# Patient Record
Sex: Male | Born: 1935 | Race: White | Hispanic: No | Smoking: Never smoker
Health system: Southern US, Community
[De-identification: ages and names within clinical notes are randomized; demographics above are authoritative.]

## PROBLEM LIST (undated history)

## (undated) DIAGNOSIS — I2510 Atherosclerotic heart disease of native coronary artery without angina pectoris: Secondary | ICD-10-CM

## (undated) DIAGNOSIS — I10 Essential (primary) hypertension: Secondary | ICD-10-CM

## (undated) HISTORY — DX: Atherosclerotic heart disease of native coronary artery without angina pectoris: I25.10

---

## 2006-11-30 ENCOUNTER — Emergency Department (HOSPITAL_COMMUNITY): Admission: EM | Admit: 2006-11-30 | Discharge: 2006-11-30 | Payer: Self-pay | Admitting: Emergency Medicine

## 2013-01-30 ENCOUNTER — Encounter (HOSPITAL_COMMUNITY): Payer: Self-pay

## 2013-01-30 ENCOUNTER — Emergency Department (HOSPITAL_COMMUNITY): Payer: Medicare Other

## 2013-01-30 ENCOUNTER — Emergency Department (HOSPITAL_COMMUNITY)
Admission: EM | Admit: 2013-01-30 | Discharge: 2013-01-30 | Disposition: A | Discharge status: Home / Self Care | Payer: Medicare Other | Attending: Emergency Medicine | Admitting: Emergency Medicine

## 2013-01-30 DIAGNOSIS — R51 Headache: Secondary | ICD-10-CM | POA: Insufficient documentation

## 2013-01-30 LAB — CBC WITH DIFFERENTIAL/PLATELET
Basophils Absolute: 0.1 10*3/uL (ref 0.0–0.1)
Eosinophils Absolute: 0.1 10*3/uL (ref 0.0–0.7)
Eosinophils Relative: 1 % (ref 0–5)
Lymphocytes Relative: 23 % (ref 12–46)
Lymphs Abs: 2 10*3/uL (ref 0.7–4.0)
MCV: 82 fL (ref 78.0–100.0)
Neutrophils Relative %: 67 % (ref 43–77)
Platelets: 286 10*3/uL (ref 150–400)
RBC: 5.12 MIL/uL (ref 4.22–5.81)
RDW: 15 % (ref 11.5–15.5)
WBC: 8.4 10*3/uL (ref 4.0–10.5)

## 2013-01-30 LAB — COMPREHENSIVE METABOLIC PANEL
ALT: 11 U/L (ref 0–53)
AST: 15 U/L (ref 0–37)
Alkaline Phosphatase: 70 U/L (ref 39–117)
CO2: 29 mEq/L (ref 19–32)
Calcium: 9.3 mg/dL (ref 8.4–10.5)
Glucose, Bld: 102 mg/dL — ABNORMAL HIGH (ref 70–99)
Potassium: 3.3 mEq/L — ABNORMAL LOW (ref 3.5–5.1)
Sodium: 136 mEq/L (ref 135–145)
Total Protein: 7.5 g/dL (ref 6.0–8.3)

## 2013-01-30 MED ORDER — KETOROLAC TROMETHAMINE 30 MG/ML IJ SOLN
15.0000 mg | Freq: Once | INTRAMUSCULAR | Status: AC
  Administered 2013-01-30: 15 mg via INTRAVENOUS
  Filled 2013-01-30: qty 1

## 2013-01-30 MED ORDER — TRAMADOL HCL 50 MG PO TABS: 50.0000 mg | ORAL_TABLET | Freq: Four times a day (QID) | ORAL | Status: DC | PRN

## 2013-01-30 MED ORDER — METOCLOPRAMIDE HCL 5 MG/ML IJ SOLN
5.0000 mg | Freq: Once | INTRAMUSCULAR | Status: AC
  Administered 2013-01-30: 5 mg via INTRAVENOUS
  Filled 2013-01-30: qty 2

## 2013-01-30 MED ORDER — DIPHENHYDRAMINE HCL 50 MG/ML IJ SOLN
12.5000 mg | Freq: Once | INTRAMUSCULAR | Status: AC
  Administered 2013-01-30: 12.5 mg via INTRAVENOUS
  Filled 2013-01-30: qty 1

## 2013-01-30 NOTE — ED Provider Notes (Signed)
History    This chart was scribed for Kevin Lennert, MD, by Frederik Pear, ED scribe. The patient was seen in room APA03/APA03 and the patient's care was started at 1539.    CSN: [id number]  Arrival date & time 01/30/13  1438   First MD Initiated Contact with Patient 01/30/13 1539      Chief Complaint  Patient presents with  . Headache    (Consider location/radiation/quality/duration/timing/severity/associated sxs/prior treatment) Patient is a 79 y.o. male presenting with headaches. The history is provided by the patient and medical records. No language interpreter was used.  Headache Pain location:  Occipital Quality:  Unable to specify Severity currently:  10/10 Onset quality:  Sudden Duration:  5 days Associated symptoms: no abdominal pain, no back pain, no congestion, no cough, no diarrhea, no fatigue, no fever, no seizures and no sinus pressure     HPI Comments: Kevin Faulkner is a 79 y.o. male who presents to the Emergency Department complaining of a sudden onset, constant posterior HA that radiates into his posterior neck that began 5 days ago. He denies fever or chills. He states that he has a h/o of headaches, but has not experienced one for a very long time. He denies allergies to medications.   History reviewed. No pertinent past medical history.  History reviewed. No pertinent past surgical history.  No family history on file.  History  Substance Use Topics  . Smoking status: Not on file  . Smokeless tobacco: Not on file  . Alcohol Use: No      Review of Systems  Constitutional: Negative for fever, appetite change and fatigue.  HENT: Negative for congestion, sinus pressure and ear discharge.   Eyes: Negative for discharge.  Respiratory: Negative for cough.   Cardiovascular: Negative for chest pain.  Gastrointestinal: Negative for abdominal pain and diarrhea.  Genitourinary: Negative for frequency and hematuria.  Musculoskeletal: Negative for back  pain.  Skin: Negative for rash.  Neurological: Positive for headaches. Negative for seizures.  Psychiatric/Behavioral: Negative for hallucinations.    Allergies  Review of patient's allergies indicates no known allergies.  Home Medications  No current outpatient prescriptions on file.  BP 124/67  Pulse 57  Temp(Src) 96.7 F (35.9 C) (Oral)  Resp 20  Ht 5\' 4"  (1.626 m)  Wt 161 lb (73.029 kg)  BMI 27.62 kg/m2  SpO2 97%  Physical Exam  Nursing note and vitals reviewed. Constitutional: He is oriented to person, place, and time. He appears well-developed.  HENT:  Head: Normocephalic.  Eyes: Conjunctivae and EOM are normal. No scleral icterus.  Neck: Neck supple. No thyromegaly present.  Cardiovascular: Normal rate and regular rhythm.  Exam reveals no gallop and no friction rub.   No murmur heard. Pulmonary/Chest: No stridor. He has no wheezes. He has no rales. He exhibits no tenderness.  Abdominal: He exhibits no distension. There is no tenderness. There is no rebound.  Musculoskeletal: Normal range of motion. He exhibits no edema.  Lymphadenopathy:    He has no cervical adenopathy.  Neurological: He is oriented to person, place, and time. Coordination normal.  Skin: No rash noted. No erythema.  Psychiatric: He has a normal mood and affect. His behavior is normal.    ED Course  Procedures (including critical care time)  DIAGNOSTIC STUDIES: Oxygen Saturation is 97% on room air, adequate by my interpretation.    COORDINATION OF CARE:  15:45- Discussed planned course of treatment with the patient, including Toradol, Benadryl, Reglan, Head CT without  contrast, and blood work, who is agreeable at this time.  16:00- Medication Orders- ketorolac (Toradol) 30 mg/ml injection 15 mg- once, diphenhydramine (benadryl) injection 12.5 mg- once, metoclopramide (reglan) injection 5 mg- once.   Labs Reviewed - No data to display No results found.   No diagnosis found.    MDM   Pt improved with tx The chart was scribed for me under my direct supervision.  I personally performed the history, physical, and medical decision making and all procedures in the evaluation of this patient.Kevin Lennert, MD 01/30/13 [phone]

## 2013-01-30 NOTE — ED Notes (Signed)
Complain of headache since Monday. Denies other symptoms

## 2014-02-20 IMAGING — CT CT HEAD W/O CM
1 of 2 series · 16 of 30 positions shown, 20 images · non-contrast
Comparison: None

CLINICAL DATA: 79-year-old male with severe headache.

CT HEAD WITHOUT CONTRAST
TECHNIQUE: Contiguous axial images were obtained from the base of
the skull through the vertex without contrast.

[Series 3: headtrauma 2.4 h60s · axial · 0.48mm/px · z∈[+90,+216]mm · 16 of 60 slices shown, 20 images]
[im 4/60  brain]
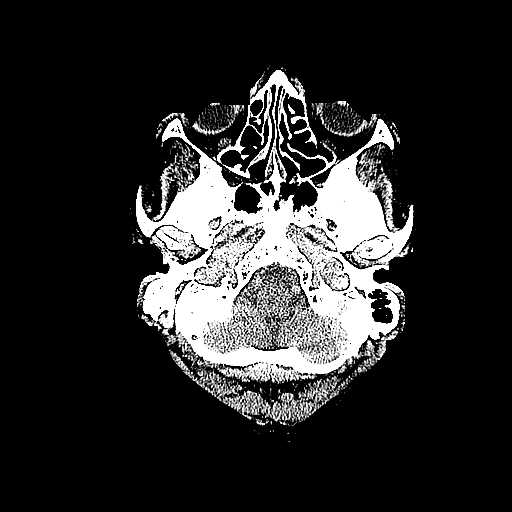
[im 4/60  bone]
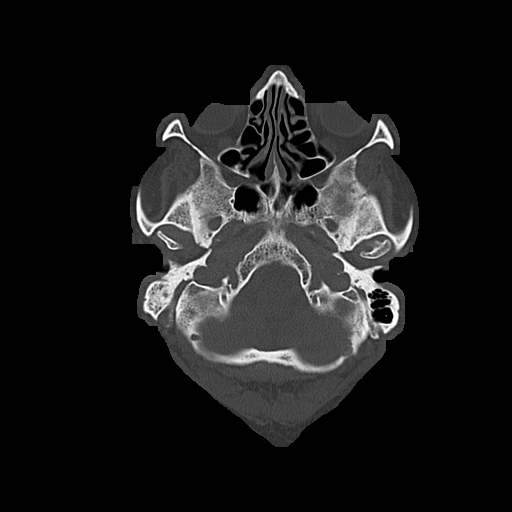
[im 7/60  brain]
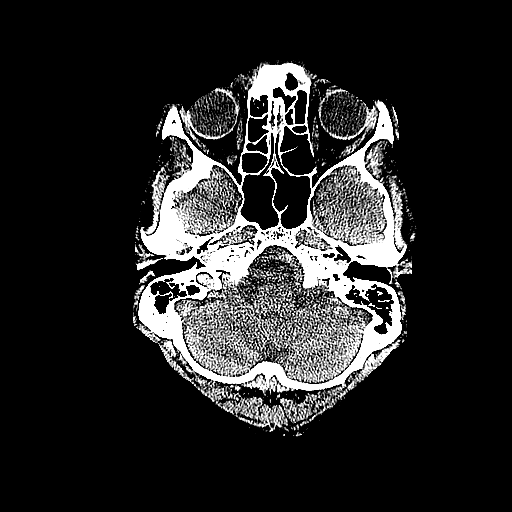
[im 10/60  brain]
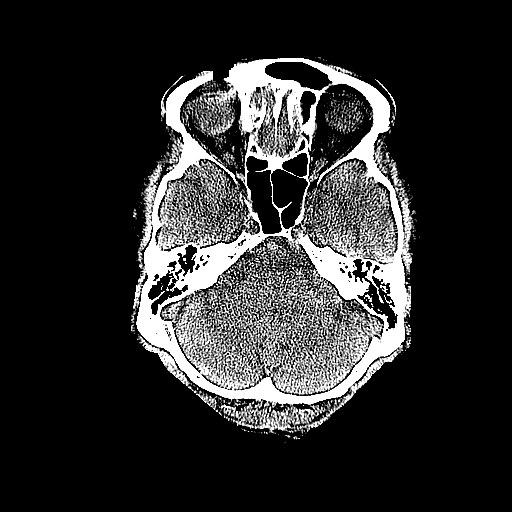
[im 13/60  brain]
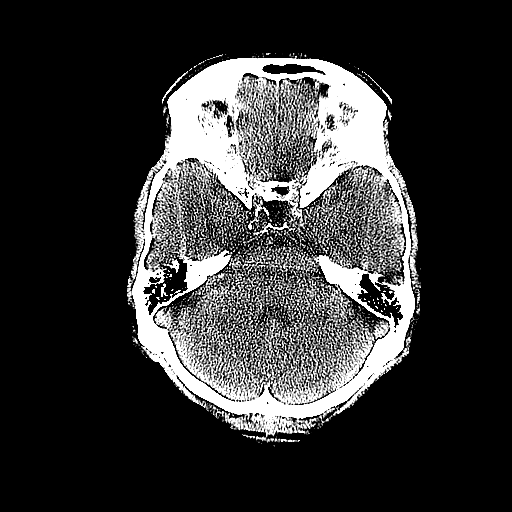
[im 19/60  brain]
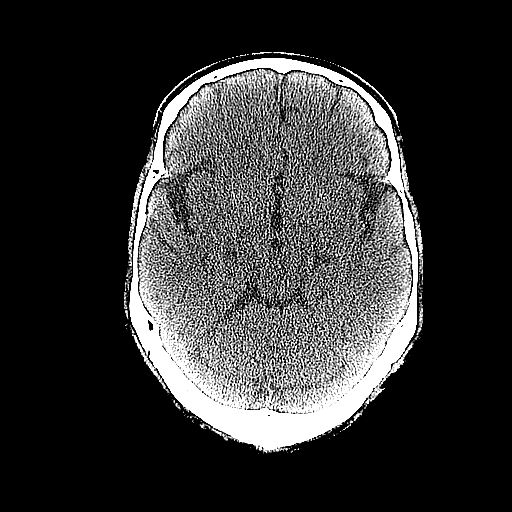
[im 19/60  bone]
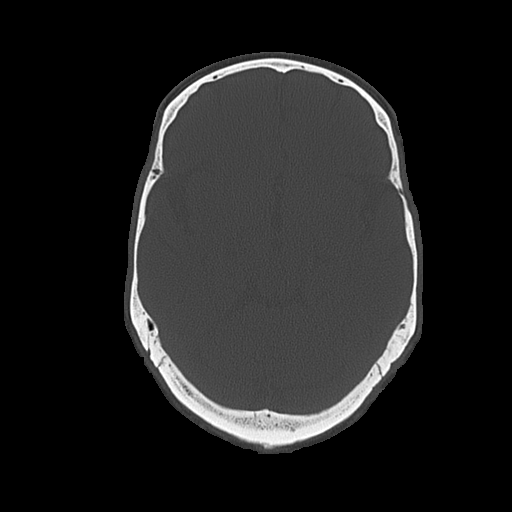
[im 22/60  brain]
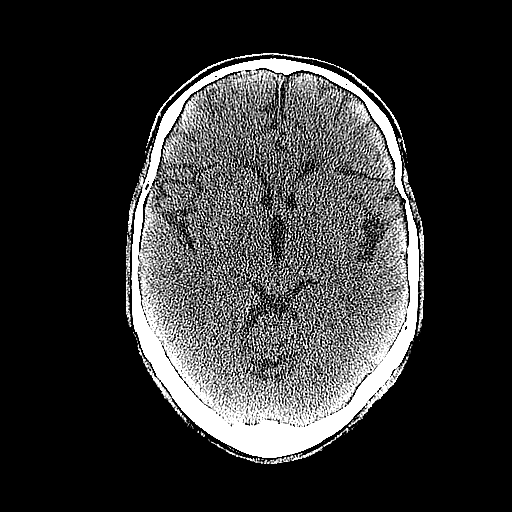
[im 25/60  brain]
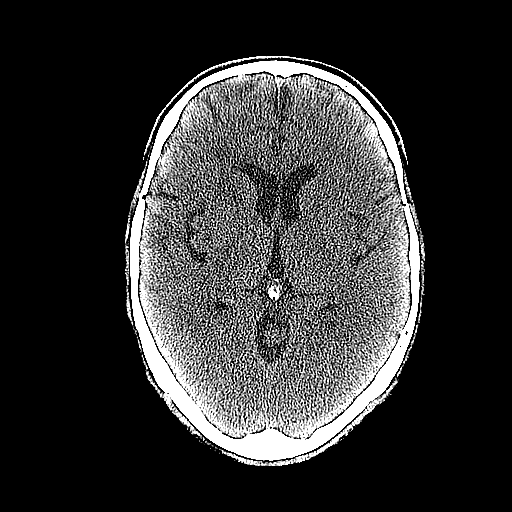
[im 28/60  brain]
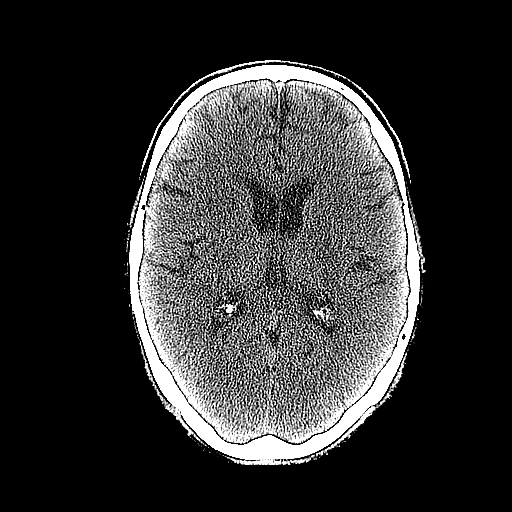
[im 32/60  brain]
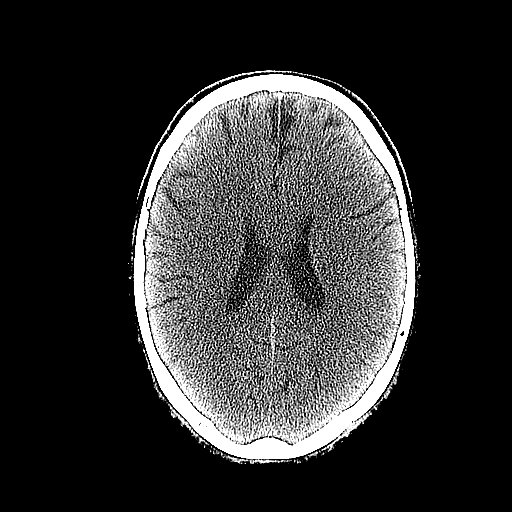
[im 32/60  bone]
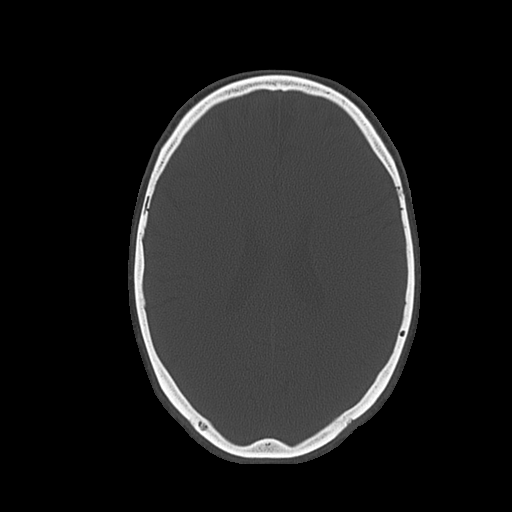
[im 35/60  brain]
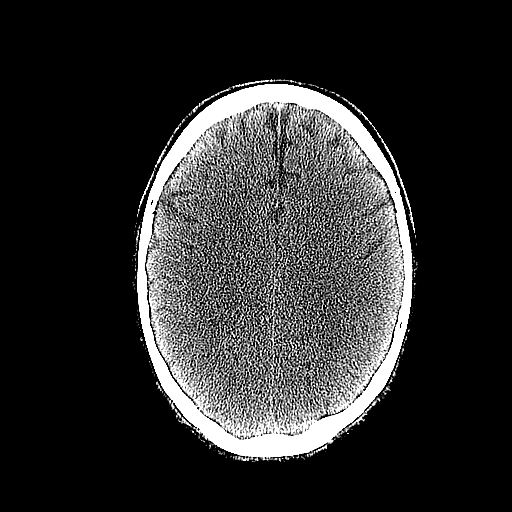
[im 38/60  brain]
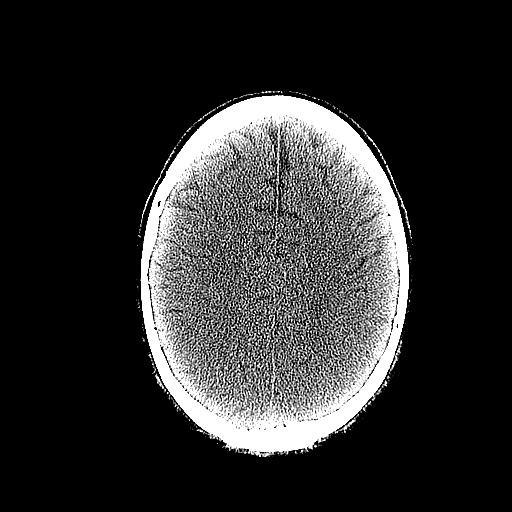
[im 41/60  brain]
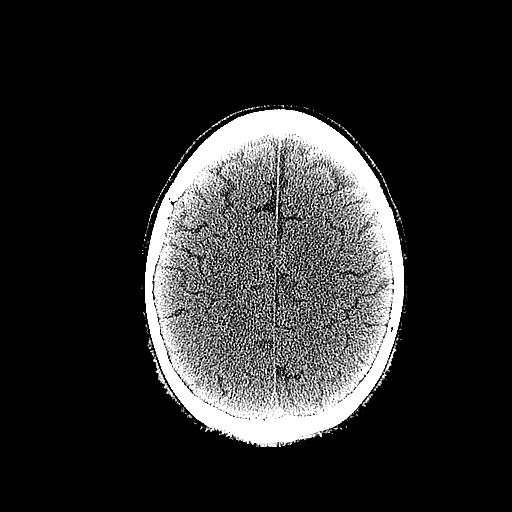
[im 47/60  brain]
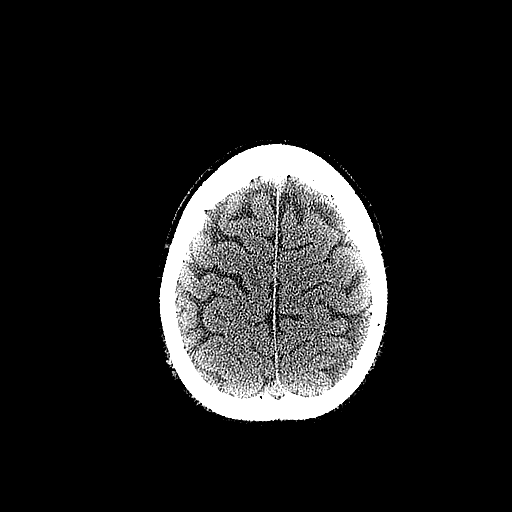
[im 47/60  bone]
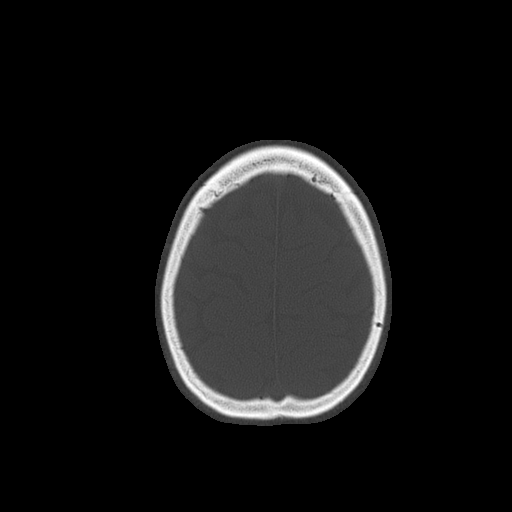
[im 50/60  brain]
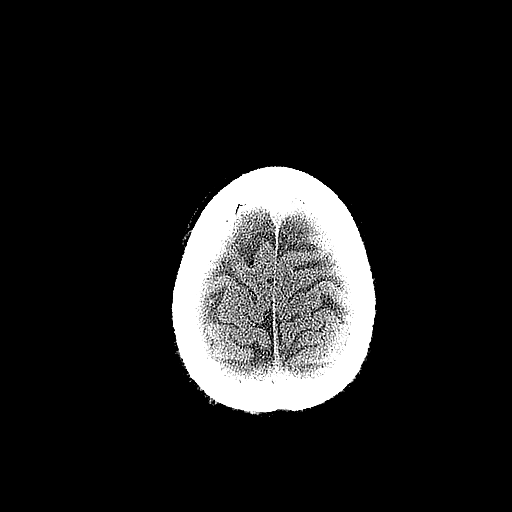
[im 53/60  brain]
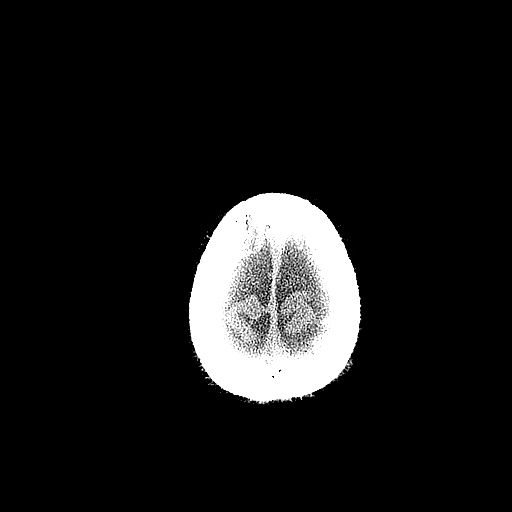
[im 56/60  brain]
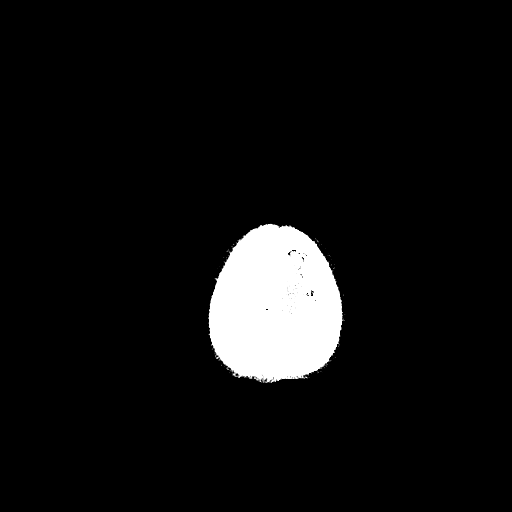

[16 of 30 positions shown; findings below may reference images not displayed]

FINDINGS: Probable mild chronic small vessel white matter ischemic
changes are noted.

No acute intracranial abnormalities are identified, including mass
lesion or mass effect, hydrocephalus, extra-axial fluid collection,
midline shift, hemorrhage, or acute infarction.

The visualized bony calvarium is unremarkable.
IMPRESSION: No evidence of acute intracranial abnormality.

## 2017-01-06 ENCOUNTER — Emergency Department (HOSPITAL_COMMUNITY): Payer: Medicare Other

## 2017-01-06 ENCOUNTER — Encounter (HOSPITAL_COMMUNITY): Payer: Self-pay | Admitting: Emergency Medicine

## 2017-01-06 ENCOUNTER — Emergency Department (HOSPITAL_COMMUNITY)
Admission: EM | Admit: 2017-01-06 | Discharge: 2017-01-06 | Disposition: A | Discharge status: Home / Self Care | Payer: Medicare Other | Attending: Dermatology | Admitting: Dermatology

## 2017-01-06 DIAGNOSIS — R531 Weakness: Secondary | ICD-10-CM | POA: Diagnosis present

## 2017-01-06 DIAGNOSIS — I10 Essential (primary) hypertension: Secondary | ICD-10-CM | POA: Diagnosis not present

## 2017-01-06 DIAGNOSIS — Z5321 Procedure and treatment not carried out due to patient leaving prior to being seen by health care provider: Secondary | ICD-10-CM | POA: Insufficient documentation

## 2017-01-06 HISTORY — DX: Essential (primary) hypertension: I10

## 2017-01-06 LAB — CBC
HCT: 40.1 % (ref 39.0–52.0)
HEMOGLOBIN: 13.1 g/dL (ref 13.0–17.0)
MCH: 28.1 pg (ref 26.0–34.0)
MCHC: 32.7 g/dL (ref 30.0–36.0)
MCV: 85.9 fL (ref 78.0–100.0)
Platelets: 188 10*3/uL (ref 150–400)
RBC: 4.67 MIL/uL (ref 4.22–5.81)
RDW: 14.3 % (ref 11.5–15.5)
WBC: 5.3 10*3/uL (ref 4.0–10.5)

## 2017-01-06 LAB — BASIC METABOLIC PANEL
Anion gap: 10 (ref 5–15)
BUN: 21 mg/dL — ABNORMAL HIGH (ref 6–20)
CHLORIDE: 98 mmol/L — AB (ref 101–111)
CO2: 26 mmol/L (ref 22–32)
CREATININE: 0.88 mg/dL (ref 0.61–1.24)
Calcium: 8.4 mg/dL — ABNORMAL LOW (ref 8.9–10.3)
GFR calc Af Amer: >60 mL/min (ref >60)
GFR calc non Af Amer: >60 mL/min (ref >60)
Glucose, Bld: 96 mg/dL (ref 65–99)
Potassium: 3.1 mmol/L — ABNORMAL LOW (ref 3.5–5.1)
Sodium: 134 mmol/L — ABNORMAL LOW (ref 135–145)

## 2017-01-06 LAB — CBG MONITORING, ED: Glucose-Capillary: 100 mg/dL — ABNORMAL HIGH (ref 65–99)

## 2017-01-06 LAB — LACTIC ACID, PLASMA: Lactic Acid, Venous: 0.7 mmol/L (ref 0.5–1.9)

## 2017-01-06 MED ORDER — ACETAMINOPHEN 325 MG PO TABS
650.0000 mg | ORAL_TABLET | Freq: Once | ORAL | Status: AC
  Administered 2017-01-06: 650 mg via ORAL
  Filled 2017-01-06: qty 2

## 2017-01-06 NOTE — ED Triage Notes (Signed)
Pt report generalized weakness with fever since Saturday.  States he is not able to walk like usual and keeps falling.  States he has a slight cough, but no other s/s of fever.  No meds taken pta.  Has fallen 3 times since yesterday and is usually active and independent.

## 2018-01-27 IMAGING — DX DG CHEST 2V
2 series · 2 of 2 positions shown · non-contrast
Comparison: None.

CLINICAL DATA: Fever and cough

EXAM:
CHEST  2 VIEW

[chest lat]
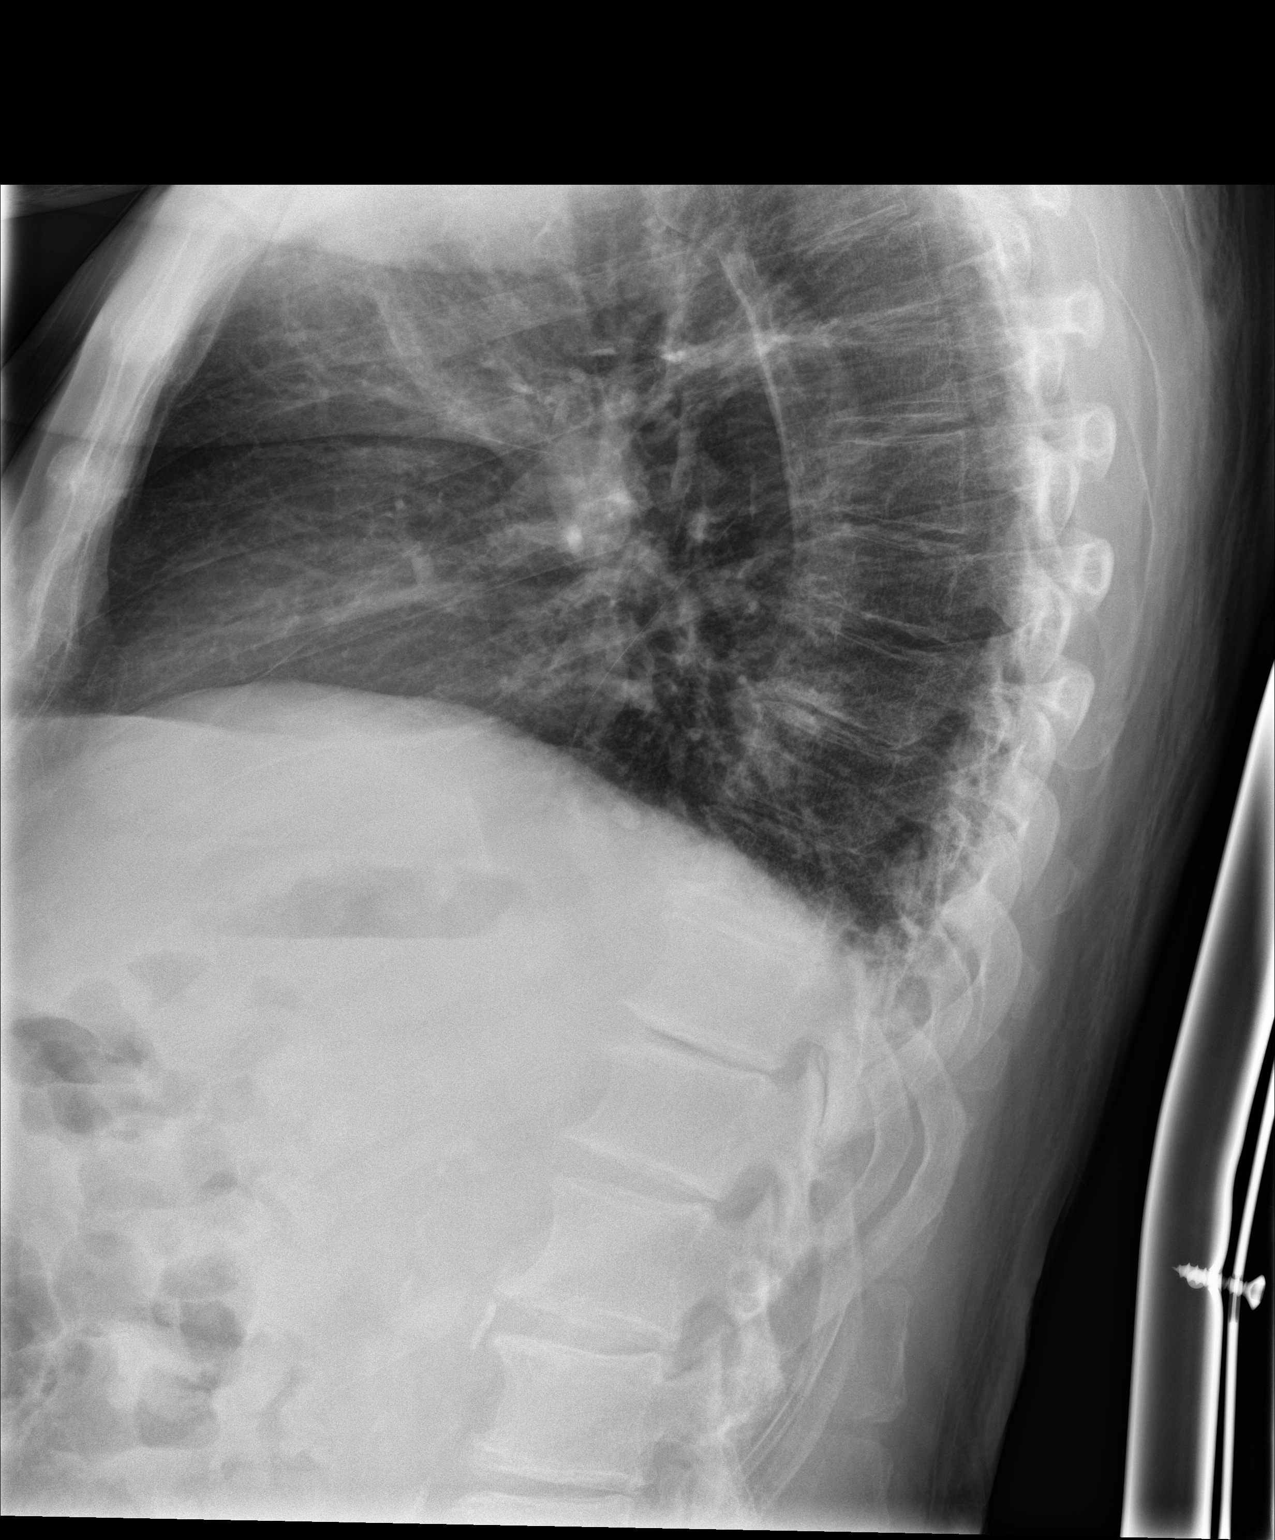

[chest ap]
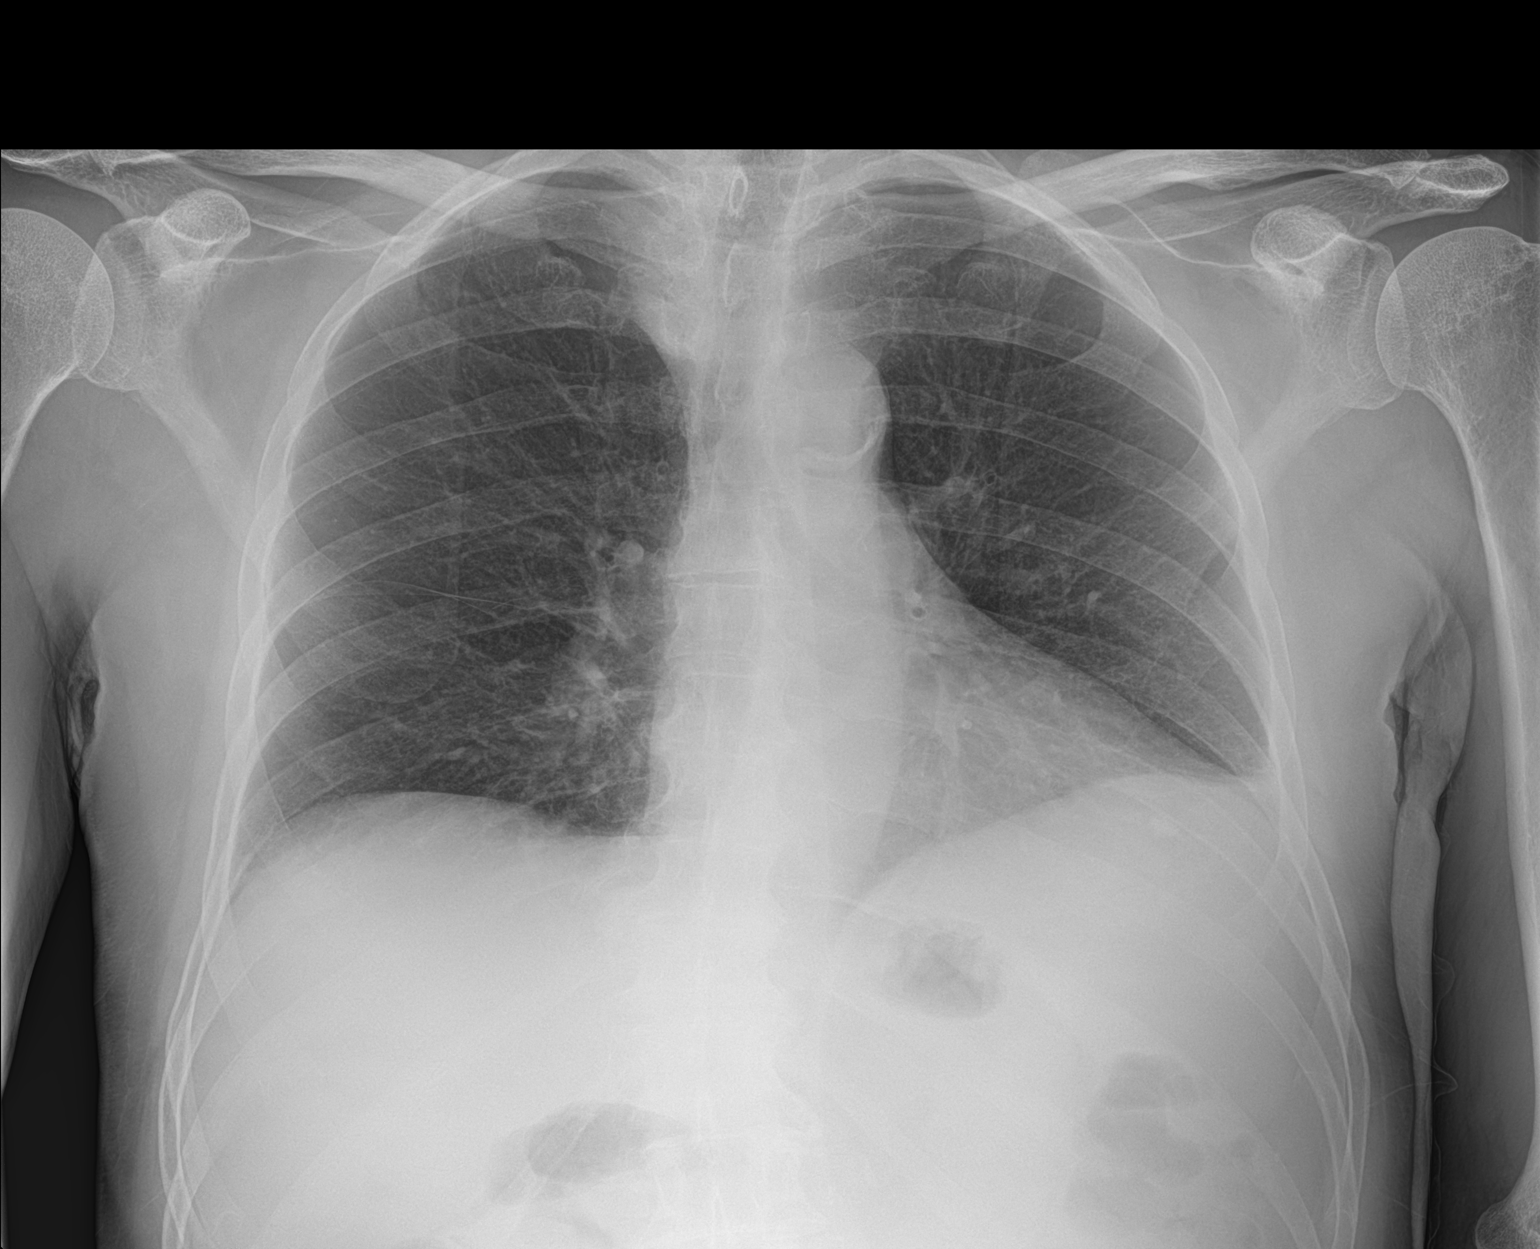

[2 of 2 positions shown; findings below may reference images not displayed]

FINDINGS: Normal mediastinum and cardiac silhouette. Normal pulmonary
vasculature. No evidence of effusion, infiltrate, or pneumothorax.
No acute bony abnormality. Low lung volumes
IMPRESSION: Low lung volumes.  No acute findings.

## 2023-12-22 ENCOUNTER — Encounter (HOSPITAL_COMMUNITY)
Admission: EM | Disposition: A | Discharge status: Home / Self Care | Payer: Self-pay | Source: Home / Self Care | Attending: Emergency Medicine

## 2023-12-22 ENCOUNTER — Other Ambulatory Visit: Payer: Self-pay

## 2023-12-22 ENCOUNTER — Encounter (HOSPITAL_COMMUNITY): Payer: Self-pay

## 2023-12-22 ENCOUNTER — Emergency Department (HOSPITAL_COMMUNITY)

## 2023-12-22 ENCOUNTER — Other Ambulatory Visit (HOSPITAL_COMMUNITY)

## 2023-12-22 ENCOUNTER — Observation Stay (HOSPITAL_COMMUNITY)
Admission: EM | Admit: 2023-12-22 | Discharge: 2023-12-23 | Disposition: A | Discharge status: Home / Self Care | Attending: Internal Medicine | Admitting: Internal Medicine

## 2023-12-22 DIAGNOSIS — I25110 Atherosclerotic heart disease of native coronary artery with unstable angina pectoris: Secondary | ICD-10-CM | POA: Diagnosis not present

## 2023-12-22 DIAGNOSIS — I214 Non-ST elevation (NSTEMI) myocardial infarction: Secondary | ICD-10-CM | POA: Diagnosis not present

## 2023-12-22 DIAGNOSIS — Z79899 Other long term (current) drug therapy: Secondary | ICD-10-CM | POA: Diagnosis not present

## 2023-12-22 DIAGNOSIS — I2510 Atherosclerotic heart disease of native coronary artery without angina pectoris: Secondary | ICD-10-CM | POA: Diagnosis not present

## 2023-12-22 DIAGNOSIS — I10 Essential (primary) hypertension: Secondary | ICD-10-CM | POA: Diagnosis not present

## 2023-12-22 DIAGNOSIS — Z955 Presence of coronary angioplasty implant and graft: Secondary | ICD-10-CM

## 2023-12-22 DIAGNOSIS — R079 Chest pain, unspecified: Secondary | ICD-10-CM | POA: Diagnosis present

## 2023-12-22 HISTORY — PX: CORONARY STENT INTERVENTION: CATH118234

## 2023-12-22 HISTORY — PX: LEFT HEART CATH AND CORONARY ANGIOGRAPHY: CATH118249

## 2023-12-22 LAB — CBC
HCT: 39.7 % (ref 39.0–52.0)
HCT: 37.3 % — ABNORMAL LOW (ref 39.0–52.0)
Hemoglobin: 13.1 g/dL (ref 13.0–17.0)
Hemoglobin: 12.6 g/dL — ABNORMAL LOW (ref 13.0–17.0)
MCH: 29 pg (ref 26.0–34.0)
MCH: 28.8 pg (ref 26.0–34.0)
MCHC: 33 g/dL (ref 30.0–36.0)
MCHC: 33.8 g/dL (ref 30.0–36.0)
MCV: 87.8 fL (ref 80.0–100.0)
MCV: 85.4 fL (ref 80.0–100.0)
Platelets: 227 10*3/uL (ref 150–400)
Platelets: 206 10*3/uL (ref 150–400)
RBC: 4.52 MIL/uL (ref 4.22–5.81)
RBC: 4.37 MIL/uL (ref 4.22–5.81)
RDW: 15.2 % (ref 11.5–15.5)
RDW: 15.1 % (ref 11.5–15.5)
WBC: 6.2 10*3/uL (ref 4.0–10.5)
WBC: 7.3 10*3/uL (ref 4.0–10.5)
nRBC: 0 % (ref 0.0–0.2)
nRBC: 0 % (ref 0.0–0.2)

## 2023-12-22 LAB — LIPID PANEL
Cholesterol: 97 mg/dL (ref 0–200)
HDL: 37 mg/dL — ABNORMAL LOW (ref >40)
LDL Cholesterol: 45 mg/dL (ref 0–99)
Total CHOL/HDL Ratio: 2.6 ratio
Triglycerides: 77 mg/dL (ref <150)
VLDL: 15 mg/dL (ref 0–40)

## 2023-12-22 LAB — BASIC METABOLIC PANEL WITH GFR
Anion gap: 10 (ref 5–15)
BUN: 19 mg/dL (ref 8–23)
CO2: 26 mmol/L (ref 22–32)
Calcium: 9 mg/dL (ref 8.9–10.3)
Chloride: 101 mmol/L (ref 98–111)
Creatinine, Ser: 0.95 mg/dL (ref 0.61–1.24)
GFR, Estimated: >60 mL/min (ref >60)
Glucose, Bld: 129 mg/dL — ABNORMAL HIGH (ref 70–99)
Potassium: 4 mmol/L (ref 3.5–5.1)
Sodium: 137 mmol/L (ref 135–145)

## 2023-12-22 LAB — POCT ACTIVATED CLOTTING TIME: Activated Clotting Time: 383 s

## 2023-12-22 LAB — PROTIME-INR
INR: 1 (ref 0.8–1.2)
Prothrombin Time: 12.9 s (ref 11.4–15.2)

## 2023-12-22 LAB — CREATININE, SERUM
Creatinine, Ser: 0.83 mg/dL (ref 0.61–1.24)
GFR, Estimated: >60 mL/min (ref >60)

## 2023-12-22 LAB — APTT: aPTT: 26 s (ref 24–36)

## 2023-12-22 LAB — TROPONIN I (HIGH SENSITIVITY)
Troponin I (High Sensitivity): 474 ng/L (ref <18)
Troponin I (High Sensitivity): 425 ng/L (ref <18)

## 2023-12-22 LAB — MRSA NEXT GEN BY PCR, NASAL: MRSA by PCR Next Gen: NOT DETECTED

## 2023-12-22 SURGERY — LEFT HEART CATH AND CORONARY ANGIOGRAPHY
Anesthesia: LOCAL

## 2023-12-22 MED ORDER — ASPIRIN 81 MG PO CHEW
324.0000 mg | CHEWABLE_TABLET | Freq: Once | ORAL | Status: AC
  Administered 2023-12-22: 324 mg via ORAL
  Filled 2023-12-22: qty 4

## 2023-12-22 MED ORDER — NITROGLYCERIN 2 % TD OINT
1.0000 [in_us] | TOPICAL_OINTMENT | Freq: Once | TRANSDERMAL | Status: AC
  Administered 2023-12-22: 1 [in_us] via TOPICAL
  Filled 2023-12-22: qty 1

## 2023-12-22 MED ORDER — LORAZEPAM 1 MG PO TABS: 1.5000 mg | ORAL_TABLET | Freq: Every day | ORAL | Status: DC

## 2023-12-22 MED ORDER — ASPIRIN 81 MG PO CHEW: 324.0000 mg | CHEWABLE_TABLET | ORAL | Status: DC

## 2023-12-22 MED ORDER — ASPIRIN 81 MG PO TBEC: 81.0000 mg | DELAYED_RELEASE_TABLET | Freq: Every day | ORAL | Status: DC

## 2023-12-22 MED ORDER — ASPIRIN 300 MG RE SUPP: 300.0000 mg | RECTAL | Status: DC

## 2023-12-22 MED ORDER — NITROGLYCERIN IN D5W 200-5 MCG/ML-% IV SOLN
INTRAVENOUS | Status: AC
  Filled 2023-12-22: qty 250

## 2023-12-22 MED ORDER — ACETAMINOPHEN 325 MG PO TABS: 650.0000 mg | ORAL_TABLET | ORAL | Status: DC | PRN

## 2023-12-22 MED ORDER — NOREPINEPHRINE 4 MG/250ML-% IV SOLN
INTRAVENOUS | Status: AC
  Filled 2023-12-22: qty 250

## 2023-12-22 MED ORDER — NOREPINEPHRINE BITARTRATE 1 MG/ML IV SOLN
INTRAVENOUS | Status: DC | PRN
  Administered 2023-12-22: 15 ug/min via INTRAVENOUS

## 2023-12-22 MED ORDER — LIDOCAINE HCL (PF) 1 % IJ SOLN
INTRAMUSCULAR | Status: AC
  Filled 2023-12-22: qty 30

## 2023-12-22 MED ORDER — SODIUM CHLORIDE 0.9% FLUSH
3.0000 mL | Freq: Two times a day (BID) | INTRAVENOUS | Status: DC
  Administered 2023-12-23: 3 mL via INTRAVENOUS

## 2023-12-22 MED ORDER — CLOPIDOGREL BISULFATE 300 MG PO TABS
ORAL_TABLET | ORAL | Status: DC | PRN
  Administered 2023-12-22: 600 mg via ORAL

## 2023-12-22 MED ORDER — SODIUM CHLORIDE 0.9 % WEIGHT BASED INFUSION
3.0000 mL/kg/h | INTRAVENOUS | Status: DC
  Administered 2023-12-22: 3 mL/kg/h via INTRAVENOUS

## 2023-12-22 MED ORDER — FENTANYL CITRATE (PF) 100 MCG/2ML IJ SOLN
INTRAMUSCULAR | Status: DC | PRN
  Administered 2023-12-22: 25 ug via INTRAVENOUS

## 2023-12-22 MED ORDER — SODIUM CHLORIDE 0.9 % WEIGHT BASED INFUSION: 1.0000 mL/kg/h | INTRAVENOUS | Status: DC

## 2023-12-22 MED ORDER — ENOXAPARIN SODIUM 40 MG/0.4ML IJ SOSY
40.0000 mg | PREFILLED_SYRINGE | INTRAMUSCULAR | Status: DC
  Administered 2023-12-23: 40 mg via SUBCUTANEOUS
  Filled 2023-12-22: qty 0.4

## 2023-12-22 MED ORDER — MIDAZOLAM HCL 2 MG/2ML IJ SOLN
INTRAMUSCULAR | Status: DC | PRN
  Administered 2023-12-22: 1 mg via INTRAVENOUS

## 2023-12-22 MED ORDER — SODIUM CHLORIDE 0.9 % WEIGHT BASED INFUSION: 3.0000 mL/kg/h | INTRAVENOUS | Status: DC

## 2023-12-22 MED ORDER — HEPARIN (PORCINE) 25000 UT/250ML-% IV SOLN
800.0000 [IU]/h | INTRAVENOUS | Status: DC
  Administered 2023-12-22: 800 [IU]/h via INTRAVENOUS
  Filled 2023-12-22: qty 250

## 2023-12-22 MED ORDER — CLOPIDOGREL BISULFATE 300 MG PO TABS
ORAL_TABLET | ORAL | Status: AC
  Filled 2023-12-22: qty 2

## 2023-12-22 MED ORDER — CHLORHEXIDINE GLUCONATE CLOTH 2 % EX PADS
6.0000 | MEDICATED_PAD | Freq: Every day | CUTANEOUS | Status: DC
  Administered 2023-12-22 – 2023-12-23 (×2): 6 via TOPICAL

## 2023-12-22 MED ORDER — LABETALOL HCL 5 MG/ML IV SOLN: 10.0000 mg | INTRAVENOUS | Status: AC | PRN

## 2023-12-22 MED ORDER — HEPARIN BOLUS VIA INFUSION
4000.0000 [IU] | Freq: Once | INTRAVENOUS | Status: AC
  Administered 2023-12-22: 4000 [IU] via INTRAVENOUS

## 2023-12-22 MED ORDER — PANTOPRAZOLE SODIUM 40 MG PO TBEC
40.0000 mg | DELAYED_RELEASE_TABLET | Freq: Every day | ORAL | Status: DC
  Administered 2023-12-22 – 2023-12-23 (×2): 40 mg via ORAL
  Filled 2023-12-22 (×2): qty 1

## 2023-12-22 MED ORDER — HYDRALAZINE HCL 20 MG/ML IJ SOLN: 10.0000 mg | INTRAMUSCULAR | Status: AC | PRN

## 2023-12-22 MED ORDER — IOHEXOL 350 MG/ML SOLN
INTRAVENOUS | Status: DC | PRN
  Administered 2023-12-22: 125 mL via INTRA_ARTERIAL

## 2023-12-22 MED ORDER — ESCITALOPRAM OXALATE 10 MG PO TABS: 10.0000 mg | ORAL_TABLET | Freq: Every day | ORAL | Status: DC

## 2023-12-22 MED ORDER — ORAL CARE MOUTH RINSE: 15.0000 mL | OROMUCOSAL | Status: DC | PRN

## 2023-12-22 MED ORDER — MELATONIN 5 MG PO TABS
5.0000 mg | ORAL_TABLET | Freq: Every day | ORAL | Status: DC
  Administered 2023-12-23: 5 mg via ORAL
  Filled 2023-12-22: qty 1

## 2023-12-22 MED ORDER — NITROGLYCERIN IN D5W 200-5 MCG/ML-% IV SOLN
INTRAVENOUS | Status: AC | PRN
  Administered 2023-12-22: 10 ug/min via INTRAVENOUS

## 2023-12-22 MED ORDER — HEPARIN SODIUM (PORCINE) 1000 UNIT/ML IJ SOLN
INTRAMUSCULAR | Status: AC
  Filled 2023-12-22: qty 10

## 2023-12-22 MED ORDER — MELATONIN 3 MG PO TABS: 3.0000 mg | ORAL_TABLET | Freq: Every day | ORAL | Status: DC

## 2023-12-22 MED ORDER — HEPARIN (PORCINE) IN NACL 1000-0.9 UT/500ML-% IV SOLN
INTRAVENOUS | Status: DC | PRN
  Administered 2023-12-22 (×2): 500 mL via INTRA_ARTERIAL

## 2023-12-22 MED ORDER — MIDAZOLAM HCL 2 MG/2ML IJ SOLN
INTRAMUSCULAR | Status: AC
  Filled 2023-12-22: qty 2

## 2023-12-22 MED ORDER — VERAPAMIL HCL 2.5 MG/ML IV SOLN
INTRAVENOUS | Status: AC
  Filled 2023-12-22: qty 2

## 2023-12-22 MED ORDER — SODIUM CHLORIDE 0.9% FLUSH: 3.0000 mL | INTRAVENOUS | Status: DC | PRN

## 2023-12-22 MED ORDER — LIDOCAINE HCL (PF) 1 % IJ SOLN
INTRAMUSCULAR | Status: DC | PRN
  Administered 2023-12-22: 2 mL
  Administered 2023-12-22: 5 mL

## 2023-12-22 MED ORDER — NITROGLYCERIN 0.4 MG SL SUBL: 0.4000 mg | SUBLINGUAL_TABLET | SUBLINGUAL | Status: DC | PRN

## 2023-12-22 MED ORDER — ONDANSETRON HCL 4 MG/2ML IJ SOLN: 4.0000 mg | Freq: Four times a day (QID) | INTRAMUSCULAR | Status: DC | PRN

## 2023-12-22 MED ORDER — HEPARIN (PORCINE) IN NACL 2-0.9 UNITS/ML
INTRAMUSCULAR | Status: DC | PRN
  Administered 2023-12-22: 10 mL via INTRA_ARTERIAL

## 2023-12-22 MED ORDER — NITROGLYCERIN 0.4 MG SL SUBL
SUBLINGUAL_TABLET | SUBLINGUAL | Status: AC
  Filled 2023-12-22: qty 1

## 2023-12-22 MED ORDER — SODIUM CHLORIDE 0.9 % IV SOLN: 250.0000 mL | INTRAVENOUS | Status: DC | PRN

## 2023-12-22 MED ORDER — NITROGLYCERIN 0.4 MG SL SUBL
SUBLINGUAL_TABLET | SUBLINGUAL | Status: DC | PRN
  Administered 2023-12-22: .4 mg via SUBLINGUAL

## 2023-12-22 MED ORDER — ASPIRIN 81 MG PO CHEW
81.0000 mg | CHEWABLE_TABLET | Freq: Every day | ORAL | Status: DC
  Administered 2023-12-23: 81 mg via ORAL
  Filled 2023-12-22: qty 1

## 2023-12-22 MED ORDER — HEPARIN SODIUM (PORCINE) 1000 UNIT/ML IJ SOLN
INTRAMUSCULAR | Status: DC | PRN
  Administered 2023-12-22: 3500 [IU] via INTRAVENOUS
  Administered 2023-12-22: 7000 [IU] via INTRAVENOUS

## 2023-12-22 MED ORDER — CLOPIDOGREL BISULFATE 75 MG PO TABS
75.0000 mg | ORAL_TABLET | Freq: Every day | ORAL | Status: DC
  Administered 2023-12-23: 75 mg via ORAL
  Filled 2023-12-22: qty 1

## 2023-12-22 MED ORDER — FENTANYL CITRATE (PF) 100 MCG/2ML IJ SOLN
INTRAMUSCULAR | Status: AC
  Filled 2023-12-22: qty 2

## 2023-12-22 SURGICAL SUPPLY — 25 items
BALL SAPPHIRE NC24 4.0X8 (BALLOONS) ×1 IMPLANT
BALLN EMERGE MR 2.5X12 (BALLOONS) ×1 IMPLANT
BALLN ~~LOC~~ EMERGE MR 3.75X8 (BALLOONS) ×1 IMPLANT
BALLOON EMERGE MR 2.5X12 (BALLOONS) IMPLANT
BALLOON SAPPHIRE NC24 4.0X8 (BALLOONS) IMPLANT
BALLOON ~~LOC~~ EMERGE MR 3.75X8 (BALLOONS) IMPLANT
CATH 5FR JL3.5 JR4 ANG PIG MP (CATHETERS) IMPLANT
CATH LAUNCHER 6FR EBU 4 SH (CATHETERS) IMPLANT
CLOSURE PERCLOSE PROSTYLE (VASCULAR PRODUCTS) IMPLANT
DEVICE RAD COMP TR BAND LRG (VASCULAR PRODUCTS) IMPLANT
GLIDESHEATH SLEND SS 6F .021 (SHEATH) IMPLANT
GUIDEWIRE INQWIRE 1.5J.035X260 (WIRE) IMPLANT
INQWIRE 1.5J .035X260CM (WIRE) ×1 IMPLANT
KIT ENCORE 26 ADVANTAGE (KITS) IMPLANT
KIT SINGLE USE MANIFOLD (KITS) IMPLANT
PACK CARDIAC CATHETERIZATION (CUSTOM PROCEDURE TRAY) ×1 IMPLANT
SET ATX-X65L (MISCELLANEOUS) IMPLANT
SHEATH 6FR 75 DEST SLENDER (SHEATH) IMPLANT
SHEATH PINNACLE 6F 10CM (SHEATH) IMPLANT
SHEATH PROBE COVER 6X72 (BAG) IMPLANT
STENT SYNERGY XD 3.50X12 (Permanent Stent) IMPLANT
SYNERGY XD 3.50X12 (Permanent Stent) ×1 IMPLANT
WIRE ASAHI PROWATER 180CM (WIRE) IMPLANT
WIRE EMERALD 3MM-J .035X150CM (WIRE) IMPLANT
WIRE MICRO SET SILHO 5FR 7 (SHEATH) IMPLANT

## 2023-12-22 NOTE — Progress Notes (Signed)
 PHARMACY - ANTICOAGULATION CONSULT NOTE  Pharmacy Consult for heparin Indication: chest pain/ACS  No Known Allergies  Patient Measurements: Height: 5\' 4"  (162.6 cm) Weight: 68 kg (149 lb 14.6 oz) IBW/kg (Calculated) : 59.2 HEPARIN DW (KG): 68  Vital Signs: Temp: 97.8 F (36.6 C) (03/31 0953) Temp Source: Temporal (03/31 0953) BP: 151/65 (03/31 1137) Pulse Rate: 96 (03/31 0953)  Labs: Recent Labs    12/22/23 1033 12/22/23 1143  HGB 13.1  --   HCT 39.7  --   PLT 227  --   CREATININE 0.95  --   TROPONINIHS 474* 425*    Estimated Creatinine Clearance: 45.9 mL/min (by C-G formula based on SCr of 0.95 mg/dL).   Medical History: Past Medical History:  Diagnosis Date   Hypertension     Medications:  (Not in a hospital admission)   Assessment: Pharmacy consulted to dose heparin in patient with chest pain/ACS.  Patient is not on anticoagulation prior to admission.  CBC WNL Trop 425  Goal of Therapy:  Heparin level 0.3-0.7 units/ml Monitor platelets by anticoagulation protocol: Yes   Plan:  Give 4000 units bolus x 1 Start heparin infusion at 800 units/hr Check anti-Xa level in 8 hours and daily while on heparin Continue to monitor H&H and platelets  Judeth Cornfield, PharmD Clinical Pharmacist 12/22/2023 12:57 PM

## 2023-12-22 NOTE — ED Notes (Signed)
 After completing Triage, Pt verbalized he wants to be seen immediately and have results back and answers immediately. Pt expressed frustration that he would have to be in the ER for a few hours while being evaluated by ED Staff.

## 2023-12-22 NOTE — ED Triage Notes (Signed)
 Pt arrived via POV from home c/o intermittent chest pain that began this past weekend. Pt denies pain at this time but is concerned for heart problems and requests evaluation.

## 2023-12-22 NOTE — ED Provider Notes (Signed)
 [facility] EMERGENCY DEPARTMENT AT [facility] Provider Note   CSN: [id number] Arrival date & time: 12/22/23  1191     History  Chief Complaint  Patient presents with   Chest Pain    Kevin Faulkner is a 89 y.o. male.  Patient is an 89 year old male who presents to the emergency department with a chief complaint of chest pain which has been ongoing for approximate the past 5 days and has been intermittent in nature.  He notes that the pain has not been exertional or positional in nature.  He does have a history of hypertension hyperlipidemia but denies any known history of cardiac disease.  He denies any family history of cardiac disease.  He denies any associated dizziness, lightheadedness or syncope.  He has had no abdominal pain, nausea, vomiting, diarrhea.  He denies any lower extremity pain or edema, hemoptysis, recent long travel or surgeries.  He notes he has been compliant with all of his home medications.  He denies any active chest pain at this time.   Chest Pain      Home Medications Prior to Admission medications   Medication Sig Start Date End Date Taking? Authorizing Provider  atenolol-chlorthalidone (TENORETIC) 100-25 MG per tablet Take 1 tablet by mouth daily.    [provider]  escitalopram (LEXAPRO) 10 MG tablet Take 10 mg by mouth daily.    [provider]  LORazepam (ATIVAN) 1 MG tablet Take 1.5 mg by mouth at bedtime.    [provider]  LORazepam (ATIVAN) 2 MG tablet Take 2 mg by mouth at bedtime.    [provider]  losartan-hydrochlorothiazide (HYZAAR) 50-12.5 MG per tablet Take 1 tablet by mouth daily.    [provider]  potassium chloride (K-DUR,KLOR-CON) 10 MEQ tablet Take 10 mEq by mouth daily.    [provider]  ranitidine (ZANTAC) 150 MG tablet Take 150 mg by mouth daily.    [provider]  simvastatin (ZOCOR) 40 MG tablet Take 40 mg by mouth at bedtime.    [provider]  traMADol (ULTRAM) 50 MG tablet Take 1 tablet (50 mg total) by mouth every 6 (six) hours as needed for pain. 01/30/13   Bethann Berkshire, MD      Allergies    Patient has no known allergies.    Review of Systems   Review of Systems  Cardiovascular:  Positive for chest pain.  All other systems reviewed and are negative.   Physical Exam Updated Vital Signs BP (!) 151/65   Pulse 96   Temp 97.8 F (36.6 C) (Temporal)   Resp (!) 21   Ht 5\' 4"  (1.626 m)   Wt 68 kg   SpO2 96%   BMI 25.73 kg/m  Physical Exam Vitals and nursing note reviewed.  Constitutional:      Appearance: Normal appearance.  HENT:     Head: Normocephalic and atraumatic.     Nose: Nose normal.     Mouth/Throat:     Mouth: Mucous membranes are moist.  Eyes:     Extraocular Movements: Extraocular movements intact.     Conjunctiva/sclera: Conjunctivae normal.     Pupils: Pupils are equal, round, and reactive to light.  Cardiovascular:     Rate and Rhythm: Normal rate and regular rhythm.     Pulses: Normal pulses.     Heart sounds: Normal heart sounds. Heart sounds not distant. No murmur heard. Pulmonary:     Effort: Pulmonary effort is normal.  No tachypnea or respiratory distress.     Breath sounds: No decreased breath sounds, wheezing, rhonchi or rales.  Abdominal:     General: Abdomen is flat. Bowel sounds are normal.     Palpations: Abdomen is soft.  Musculoskeletal:        General: Normal range of motion.     Cervical back: Normal range of motion and neck supple.     Right lower leg: No edema.     Left lower leg: No edema.  Skin:    General: Skin is warm and dry.     Findings: No rash.  Neurological:     General: No focal deficit present.     Mental Status: He is alert and oriented to person, place, and time. Mental status is at baseline.  Psychiatric:        Mood and Affect: Mood normal.        Behavior: Behavior normal.        Thought Content: Thought content normal.         Judgment: Judgment normal.     ED Results / Procedures / Treatments   Labs (all labs ordered are listed, but only abnormal results are displayed) Labs Reviewed  BASIC METABOLIC PANEL WITH GFR - Abnormal; Notable for the following components:      Result Value   Glucose, Bld 129 (*)    All other components within normal limits  TROPONIN I (HIGH SENSITIVITY) - Abnormal; Notable for the following components:   Troponin I (High Sensitivity) 474 (*)    All other components within normal limits  TROPONIN I (HIGH SENSITIVITY) - Abnormal; Notable for the following components:   Troponin I (High Sensitivity) 425 (*)    All other components within normal limits  CBC    EKG EKG Interpretation Date/Time:  Monday December 22 2023 09:51:07 EDT Ventricular Rate:  98 PR Interval:  190 QRS Duration:  90 QT Interval:  350 QTC Calculation: 446 R Axis:   -30  Text Interpretation: Normal sinus rhythm Left axis deviation Left ventricular hypertrophy with repolarization abnormality ( R in aVL ) Confirmed by Pricilla Loveless [id number]) on 12/22/2023 9:59:51 AM  Radiology DG Chest 2 View Result Date: 12/22/2023 CLINICAL DATA:  Chest pain. EXAM: CHEST - 2 VIEW COMPARISON:  01/06/2017. FINDINGS: Bilateral lung fields are clear. Bilateral costophrenic angles are clear. Note is made of elevated left hemidiaphragm. Normal cardio-mediastinal silhouette. No acute osseous abnormalities. The soft tissues are within normal limits. IMPRESSION: No active cardiopulmonary disease. Electronically Signed   By: Jules Schick M.D.   On: 12/22/2023 11:08    Procedures .Critical Care  Performed by: Lelon Perla, PA-C Authorized by: Lelon Perla, PA-C   Critical care provider statement:    Critical care time (minutes):  35   Critical care was necessary to treat or prevent imminent or life-threatening deterioration of the following conditions: NSTEMI, heparin drip.   Critical care was time spent personally  by me on the following activities:  Development of treatment plan with patient or surrogate, discussions with consultants, evaluation of patient's response to treatment, examination of patient, ordering and review of laboratory studies, ordering and review of radiographic studies, ordering and performing treatments and interventions, pulse oximetry, re-evaluation of patient's condition and review of old charts   I assumed direction of critical care for this patient from another provider in my specialty: no     Care discussed with: admitting provider       Medications Ordered in  ED Medications  aspirin chewable tablet 324 mg (324 mg Oral Given 12/22/23 1137)    ED Course/ Medical Decision Making/ A&P                                 Medical Decision Making Amount and/or Complexity of Data Reviewed Labs: ordered. Radiology: ordered.  Risk OTC drugs. Prescription drug management. Decision regarding hospitalization.   This patient presents to the ED for concern of chest pain, this involves an extensive number of treatment options, and is a complaint that carries with it a high risk of complications and morbidity.  The differential diagnosis includes ACS, PE, acute CHF, pericarditis, myocarditis, endocarditis, pneumonia, pneumothorax, hemothorax   Co morbidities that complicate the patient evaluation  Hypertension, hyperlipidemia   Additional history obtained:  Additional history obtained from none External records from outside source obtained and reviewed including none   Lab Tests:  I Ordered, and personally interpreted labs.  The pertinent results include: Elevated troponin, normal kidney function, no leukocytosis, no anemia   Imaging Studies ordered:  I ordered imaging studies including chest x-ray I independently visualized and interpreted imaging which showed cardiopulmonary process I agree with the radiologist interpretation   Cardiac Monitoring: / EKG:  The  patient was maintained on a cardiac monitor.  I personally viewed and interpreted the cardiac monitored which showed an underlying rhythm of: Normal sinus rhythm, left axis deviation, mild elevation in aVR, mild depressions in V5 and V6   Consultations Obtained:  I requested consultation with the cardiology, Dr. Jenene Slicker,  and discussed lab and imaging findings as well as pertinent plan - they recommend: Admission and transfer to [facility]   Problem List / ED Course / Critical interventions / Medication management  Does remain stable at this time.  I did discuss patient case with Dr. Jenene Slicker who did fully evaluate the patient and recommend admission to [facility].  Patient has been placed on a heparin drip, nitroglycerin paste and provided with aspirin.  He does continue to have stable vital signs at this point.  He had no active pain on initial presentation.  Do not Spectamine etiology such as pulmonary embolus, aortic aneurysm or dissection in this patient. I ordered medication including heparin drip, nitroglycerin, aspirin for NSTEMI Reevaluation of the patient after these medicines showed that the patient improved I have reviewed the patients home medicines and have made adjustments as needed   Social Determinants of Health:  None   Test / Admission - Considered:  Admission        Final Clinical Impression(s) / ED Diagnoses Final diagnoses:  None    Rx / DC Orders ED Discharge Orders     None         Lelon Perla, PA-C 12/22/23 1357    Pricilla Loveless, MD 12/22/23 1501

## 2023-12-22 NOTE — H&P (Signed)
 CARDIOLOGY HISTORY AND PHYSICAL NOTE    Patient ID: Kevin Faulkner; [id number]; 07/16/1934   Admit date: 12/22/2023 Date of Consult: 12/22/2023  Primary Care Provider: Drucie Opitz, MD Primary Cardiologist:  Primary Electrophysiologist:    History of Present Illness:   Mr. Kevin Faulkner is a 89 year old M known to have HTN presented to the ER with chest pain.  He started to have substernal chest tightness when he was doing yard work on late Friday evening from last week.  It lasted for few minutes, he stopped the yard work and went inside his house to rest which was when the chest pain resolved.  He did not have any chest pain on Saturday but had a recurrence of severe chest pain on Sunday afternoon.  Since then, he has lingering chest discomfort 1-2/10 in intensity.  Currently has 1-2/10 lingering chest pain during my interview.  EKG showed NSR, ST-T abnormality in the lateral leads (1 and aVL). Hs troponins elevated, 474>>425.  Never smoked cigarettes.  Never had any MI/PCI in the past.  Very active at baseline.  Past Medical History:  Diagnosis Date   Hypertension     History reviewed. No pertinent surgical history.     Inpatient Medications: Scheduled Meds:  heparin  4,000 Units Intravenous Once   nitroGLYCERIN  1 inch Topical Once   Continuous Infusions:  heparin     PRN Meds:   Allergies:   No Known Allergies  Social History:   Social History   Socioeconomic History   Marital status: Widowed    Spouse name: Not on file   Number of children: Not on file   Years of education: Not on file   Highest education level: Not on file  Occupational History   Not on file  Tobacco Use   Smoking status: Never   Smokeless tobacco: Not on file  Vaping Use   Vaping status: Never Used  Substance and Sexual Activity   Alcohol use: No   Drug use: No   Sexual activity: Not on file  Other Topics Concern   Not on file  Social History Narrative   Not on file    Social Drivers of Health   Financial Resource Strain: Not on file  Food Insecurity: Not on file  Transportation Needs: Not on file  Physical Activity: Not on file  Stress: Not on file  Social Connections: Not on file  Intimate Partner Violence: Not on file    Family History:   History reviewed. No pertinent family history.   ROS:  Please see the history of present illness.  ROS  All other ROS reviewed and negative.     Physical Exam/Data:   Vitals:   12/22/23 0953 12/22/23 0954 12/22/23 1137  BP: (!) 144/78  (!) 151/65  Pulse: 96    Resp: 16  (!) 21  Temp: 97.8 F (36.6 C)    TempSrc: Temporal    SpO2: 96%    Weight:  68 kg   Height:  5\' 4"  (1.626 m)    No intake or output data in the 24 hours ending 12/22/23 1314 Filed Weights   12/22/23 0954  Weight: 68 kg   Body mass index is 25.73 kg/m.  General:  Well nourished, well developed, in no acute distress HEENT: normal Lymph: no adenopathy Neck: no JVD Endocrine:  No thryomegaly Vascular: No carotid bruits; FA pulses 2+ bilaterally without bruits  Cardiac:  normal S1, S2; RRR; no murmur  Lungs:  clear to  auscultation bilaterally, no wheezing, rhonchi or rales  Abd: soft, nontender, no hepatomegaly  Ext: no edema Musculoskeletal:  No deformities, BUE and BLE strength normal and equal Skin: warm and dry  Neuro:  CNs 2-12 intact, no focal abnormalities noted Psych:  Normal affect   EKG:  The EKG was personally reviewed and demonstrates:   Telemetry:  Telemetry was personally reviewed and demonstrates:    Relevant CV Studies:   Laboratory Data:  Chemistry Recent Labs  Lab 12/22/23 1033  NA 137  K 4.0  CL 101  CO2 26  GLUCOSE 129*  BUN 19  CREATININE 0.95  CALCIUM 9.0  GFRNONAA >60  ANIONGAP 10    No results for input(s): "PROT", "ALBUMIN", "AST", "ALT", "ALKPHOS", "BILITOT" in the last 168 hours. Hematology Recent Labs  Lab 12/22/23 1033  WBC 6.2  RBC 4.52  HGB 13.1  HCT 39.7  MCV  87.8  MCH 29.0  MCHC 33.0  RDW 15.2  PLT 227   Cardiac EnzymesNo results for input(s): "TROPONINI" in the last 168 hours. No results for input(s): "TROPIPOC" in the last 168 hours.  BNPNo results for input(s): "BNP", "PROBNP" in the last 168 hours.  DDimer No results for input(s): "DDIMER" in the last 168 hours.  Radiology/Studies:  DG Chest 2 View Result Date: 12/22/2023 CLINICAL DATA:  Chest pain. EXAM: CHEST - 2 VIEW COMPARISON:  01/06/2017. FINDINGS: Bilateral lung fields are clear. Bilateral costophrenic angles are clear. Note is made of elevated left hemidiaphragm. Normal cardio-mediastinal silhouette. No acute osseous abnormalities. The soft tissues are within normal limits. IMPRESSION: No active cardiopulmonary disease. Electronically Signed   By: Jules Schick M.D.   On: 12/22/2023 11:08    Assessment and Plan:   NSTEMI: Presented with waxing waning of substernal chest tightness x 3 to 4 days prior to presentation.  Currently has 1-2/10 lingering chest discomfort during my interview.  EKG showed NSR, ST-T wave abnormality in the lateral leads (1 and aVL, which is new). Hs troponins elevated, 474>> 425.  Start ACS protocol, aspirin 325 mg was already administered.  Start heparin drip.  He is on simvastatin at home, will continue.  Due to active chest pain, will have him on Nitropaste.  Obtain 2D echocardiogram and lipid panel.  N.p.o. today for transfer to [facility] for LHC.  Last time he ate was 8:30 AM this morning, light cereal.  Informed consent for LHC Risks and benefits of cardiac catheterization have been discussed with the patient.  These include bleeding, infection, kidney damage, stroke, heart attack, death.  The patient understands these risks and is willing to proceed.  HTN, partially controlled: Nitropaste ordered due to active chest pain.  Will hold home antihypertensive medications for now.   For questions or updates, please contact [facility] Please consult  [email or web address] for contact info under Cardiology/STEMI.   Signed, Herbert Deaner, MD 12/22/2023 1:14 PM

## 2023-12-22 NOTE — Progress Notes (Signed)
 Received from [facility] by [employer]. Alert and oriented. Denies pain; denies chest discomfort. Arrived w/Heparin infusing at 800 units per hour to left arm.

## 2023-12-22 NOTE — Interval H&P Note (Signed)
 History and Physical Interval Note:  12/22/2023 4:44 PM  Kevin Faulkner  has presented today for surgery, with the diagnosis of nstemi.  The various methods of treatment have been discussed with the patient and family. After consideration of risks, benefits and other options for treatment, the patient has consented to  Procedure(s): LEFT HEART CATH AND CORONARY ANGIOGRAPHY (N/A) as a surgical intervention.  The patient's history has been reviewed, patient examined, no change in status, stable for surgery.  I have reviewed the patient's chart and labs.  Questions were answered to the patient's satisfaction.    Cath Lab Visit (complete for each Cath Lab visit)  Clinical Evaluation Leading to the Procedure:   ACS: Yes.    Non-ACS:    Anginal Classification: CCS IV  Anti-ischemic medical therapy: Minimal Therapy (1 class of medications)  Non-Invasive Test Results: No non-invasive testing performed  Prior CABG: No previous CABG       Theron [facility] 12/22/2023 4:45 PM

## 2023-12-23 ENCOUNTER — Other Ambulatory Visit (HOSPITAL_COMMUNITY): Payer: Self-pay

## 2023-12-23 ENCOUNTER — Encounter (HOSPITAL_COMMUNITY): Payer: Self-pay | Admitting: Cardiology

## 2023-12-23 ENCOUNTER — Inpatient Hospital Stay (HOSPITAL_BASED_OUTPATIENT_CLINIC_OR_DEPARTMENT_OTHER)

## 2023-12-23 DIAGNOSIS — I2510 Atherosclerotic heart disease of native coronary artery without angina pectoris: Secondary | ICD-10-CM

## 2023-12-23 DIAGNOSIS — I10 Essential (primary) hypertension: Secondary | ICD-10-CM | POA: Diagnosis not present

## 2023-12-23 DIAGNOSIS — I214 Non-ST elevation (NSTEMI) myocardial infarction: Secondary | ICD-10-CM | POA: Diagnosis not present

## 2023-12-23 DIAGNOSIS — Z79899 Other long term (current) drug therapy: Secondary | ICD-10-CM | POA: Diagnosis not present

## 2023-12-23 DIAGNOSIS — Z955 Presence of coronary angioplasty implant and graft: Secondary | ICD-10-CM | POA: Diagnosis not present

## 2023-12-23 DIAGNOSIS — I25110 Atherosclerotic heart disease of native coronary artery with unstable angina pectoris: Secondary | ICD-10-CM | POA: Diagnosis not present

## 2023-12-23 DIAGNOSIS — I5030 Unspecified diastolic (congestive) heart failure: Secondary | ICD-10-CM

## 2023-12-23 LAB — CBC
HCT: 34.7 % — ABNORMAL LOW (ref 39.0–52.0)
Hemoglobin: 11.8 g/dL — ABNORMAL LOW (ref 13.0–17.0)
MCH: 28.7 pg (ref 26.0–34.0)
MCHC: 34 g/dL (ref 30.0–36.0)
MCV: 84.4 fL (ref 80.0–100.0)
Platelets: 221 10*3/uL (ref 150–400)
RBC: 4.11 MIL/uL — ABNORMAL LOW (ref 4.22–5.81)
RDW: 15.3 % (ref 11.5–15.5)
WBC: 7.5 10*3/uL (ref 4.0–10.5)
nRBC: 0 % (ref 0.0–0.2)

## 2023-12-23 LAB — BASIC METABOLIC PANEL WITH GFR
Anion gap: 12 (ref 5–15)
BUN: 13 mg/dL (ref 8–23)
CO2: 21 mmol/L — ABNORMAL LOW (ref 22–32)
Calcium: 8.6 mg/dL — ABNORMAL LOW (ref 8.9–10.3)
Chloride: 105 mmol/L (ref 98–111)
Creatinine, Ser: 0.83 mg/dL (ref 0.61–1.24)
GFR, Estimated: >60 mL/min (ref >60)
Glucose, Bld: 103 mg/dL — ABNORMAL HIGH (ref 70–99)
Potassium: 3.4 mmol/L — ABNORMAL LOW (ref 3.5–5.1)
Sodium: 138 mmol/L (ref 135–145)

## 2023-12-23 LAB — ECHOCARDIOGRAM COMPLETE
Area-P 1/2: 2.22 cm2
Calc EF: 45.8 %
Height: 64 in
S' Lateral: 2.3 cm
Single Plane A2C EF: 44.7 %
Single Plane A4C EF: 45.9 %
Weight: 2398.6 [oz_av]

## 2023-12-23 LAB — LIPID PANEL
Cholesterol: 87 mg/dL (ref 0–200)
HDL: 29 mg/dL — ABNORMAL LOW (ref >40)
LDL Cholesterol: 39 mg/dL (ref 0–99)
Total CHOL/HDL Ratio: 3 ratio
Triglycerides: 93 mg/dL (ref <150)
VLDL: 19 mg/dL (ref 0–40)

## 2023-12-23 MED ORDER — NITROGLYCERIN 0.4 MG SL SUBL
0.4000 mg | SUBLINGUAL_TABLET | SUBLINGUAL | 2 refills | Status: AC | PRN
  Filled 2023-12-23: qty 25, 8d supply, fill #0

## 2023-12-23 MED ORDER — POTASSIUM CHLORIDE CRYS ER 20 MEQ PO TBCR
40.0000 meq | EXTENDED_RELEASE_TABLET | Freq: Once | ORAL | Status: AC
  Administered 2023-12-23: 40 meq via ORAL
  Filled 2023-12-23: qty 2

## 2023-12-23 MED ORDER — CLOPIDOGREL BISULFATE 75 MG PO TABS
75.0000 mg | ORAL_TABLET | Freq: Every day | ORAL | 3 refills | Status: DC
  Filled 2023-12-23: qty 30, 30d supply, fill #0

## 2023-12-23 MED ORDER — PANTOPRAZOLE SODIUM 40 MG PO TBEC
40.0000 mg | DELAYED_RELEASE_TABLET | Freq: Every day | ORAL | 2 refills | Status: DC | PRN
  Filled 2023-12-23: qty 30, 30d supply, fill #0

## 2023-12-23 MED ORDER — ASPIRIN 81 MG PO CHEW
81.0000 mg | CHEWABLE_TABLET | Freq: Every day | ORAL | 3 refills | Status: DC
  Filled 2023-12-23: qty 30, 30d supply, fill #0

## 2023-12-23 MED ORDER — ASPIRIN 81 MG PO TBEC
81.0000 mg | DELAYED_RELEASE_TABLET | Freq: Every day | ORAL | 2 refills | Status: AC
  Filled 2023-12-23: qty 150, 150d supply, fill #0

## 2023-12-23 NOTE — Care Management Obs Status (Addendum)
 MEDICARE OBSERVATION STATUS NOTIFICATION   Patient Details  Name: Kevin Faulkner MRN: [id number] Date of Birth: 1933/10/03   Medicare Observation Status Notification Given:  Yes   I, Elliot Cousin, RN, verbally reviewed observation notice with patient and patient gave permission for daughter to sign form.  12/23/2023, 10:43 AM     Elliot Cousin, RN 12/23/2023, 10:25 AM

## 2023-12-23 NOTE — TOC Benefit Eligibility Note (Signed)
 Pharmacy Patient Advocate Encounter  Insurance verification completed.    The patient is insured through [employer]. Patient has [employer], may use a copay card, and/or apply for patient assistance if available.    Ran test claim for Entresto and the current 30 day co-pay is $16.  Ran test claim for Jardiance  and the current 30 day co-pay is $43.  Ran test claim for Marcelline Deist and the current 30 day co-pay is requires a prior authorization.   This test claim was processed through [facility]- copay amounts may vary at other pharmacies due to pharmacy/plan contracts, or as the patient moves through the different stages of their insurance plan.

## 2023-12-23 NOTE — TOC Initial Note (Signed)
 Transition of Care [facility]) - Initial/Assessment Note    Patient Details  Name: Kevin Faulkner MRN: [id number] Date of Birth: 1934-08-13  Transition of Care [facility]) CM/SW Contact:    Elliot Cousin, RN Phone Number: [phone] 12/23/2023, 10:39 AM  Clinical Narrative:                  TOC CM spoke to pt and states he lives alone. Daughter assist in home. Gave permission to speak with daughter and complete Medicare obs form. Pt has cane at home. Daughter has RW he can use if needed. Dtr will provide transportation home.  Expected Discharge Plan: Home/Self Care Barriers to Discharge: No Barriers Identified   Patient Goals and CMS Choice Patient states their goals for this hospitalization and ongoing recovery are:: wants to get beter          Expected Discharge Plan and Services   Discharge Planning Services: CM Consult   Living arrangements for the past 2 months: Single Family Home Expected Discharge Date: 12/23/23                                    Prior Living Arrangements/Services Living arrangements for the past 2 months: Single Family Home Lives with:: Self Patient language and need for interpreter reviewed:: Yes Do you feel safe going back to the place where you live?: Yes      Need for Family Participation in Patient Care: No (Comment) Care giver support system in place?: Yes (comment)   Criminal Activity/Legal Involvement Pertinent to Current Situation/Hospitalization: No - Comment as needed  Activities of Daily Living   ADL Screening (condition at time of admission) Independently performs ADLs?: Yes (appropriate for developmental age) Is the patient deaf or have difficulty hearing?: No Does the patient have difficulty seeing, even when wearing glasses/contacts?: No Does the patient have difficulty concentrating, remembering, or making decisions?: No  Permission Sought/Granted Permission sought to share information with : Case Manager Permission  granted to share information with : Yes, Verbal Permission Granted  Share Information with NAME: Noberto Retort     Permission granted to share info w Relationship: daughter  Permission granted to share info w Contact Information: [phone]  Emotional Assessment Appearance:: Appears younger than stated age Attitude/Demeanor/Rapport: Gracious Affect (typically observed): Accepting Orientation: : Oriented to Self, Oriented to Place, Oriented to  Time, Oriented to Situation   Psych Involvement: No (comment)  Admission diagnosis:  NSTEMI (non-ST elevated myocardial infarction) [facility]) [I21.4] Patient Active Problem List   Diagnosis Date Noted   NSTEMI (non-ST elevated myocardial infarction) (HCC) 12/22/2023   PCP:  Drucie Opitz, MD Pharmacy:   [facility] [phone] [place], [place] - [address] [place] [address] Phone: [phone] Fax: [phone]  [id number] [place] - [address] [facility] [address] [place] [address] Phone: [phone] Fax: [phone]  [facility] [address] [place] [address] Phone: [phone] Fax: [phone]     Social Drivers of Health (SDOH) Social History: SDOH Screenings   Food Insecurity: No Food Insecurity (12/23/2023)  Housing: Low Risk  (12/23/2023)  Transportation Needs: No Transportation Needs (12/23/2023)  Utilities: Not At Risk (12/23/2023)  Social Connections: Moderately Integrated (12/23/2023)  Tobacco Use: Unknown (12/22/2023)   SDOH Interventions:     Readmission Risk Interventions  No data to display

## 2023-12-23 NOTE — Discharge Instructions (Signed)

## 2023-12-23 NOTE — Progress Notes (Signed)
 Echocardiogram 2D Echocardiogram has been performed.  Warren Lacy Aqeel Norgaard RDCS 12/23/2023, 8:43 AM

## 2023-12-23 NOTE — Progress Notes (Signed)
 CARDIAC REHAB PHASE I   PRE:  Rate/Rhythm: 90 SR   BP:  Sitting: 159/75      SaO2: 98 RA  MODE:  Ambulation: 150 ft   POST:  Rate/Rhythm: 101 ST  BP:  Sitting: 132/71      SaO2: 98 RA  Pt ambulated independently in hallway. Tolerated well with no CP, dizziness or SOB. Returned to chair with call bell and bedside table in reach. Post MI/stent education including restrictions, risk factors, exercise guidelines, antiplatelet therapy importance, MI booklet, NTG use, heart healthy diet and CRP2 reviewed. All questions and concerns addressed. Will refer to [facility] for CRP2. Plan for discharge home today.   1025 - 1105 Woodroe Chen, [place] BSN 12/23/2023 11:02 AM

## 2023-12-23 NOTE — Discharge Summary (Signed)
 Physician Discharge Summary      Patient ID: Kevin Faulkner MRN: [id number] DOB/AGE: 88/19/1935 88 y.o. 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 Successful emergent PCI with stenting of the Left main with Synergy Xd 3.50x12 DES x 1 via femoral approach.     EKG 12/22/2023 at 9:51 AM: Normal sinus rhythm at rate of 98 bpm, left anterior fascicular block.  LVH by voltage in aVL.  ST depression in 1 and aVL and V4-V6 suggest lateral ischemia.  EKG 12/22/2023: Normal sinus rhythm at rate of 75 bpm, normal EKG.  Previously noted ST depressions in lateral leads not present.  Radiology:  DG Chest 2 View 12/22/2023 CLINICAL DATA:  Chest pain. EXAM: CHEST - 2 VIEW COMPARISON:  01/06/2017.  FINDINGS: Bilateral lung fields are clear. Bilateral costophrenic angles are clear. Note is made of elevated left hemidiaphragm. Normal cardio-mediastinal silhouette. No acute osseous abnormalities. The soft tissues are within normal limits.  Hospital Course: Kevin Faulkner is a 89 y.o. male  patient who is very active and lives independently, he like to race cars in his younger days, hypertension, presented to [facility] with unstable angina and chest pain, EKG suggestive of lateral subendocardial infarct, positive cardiac markers for NSTEMI.  He was transferred to [facility] and underwent cardiac catheterization revealing high-grade ostial left main 90% plus stenosis for which he underwent stenting with excellent results.  The following morning he remained asymptomatic without chest pain, ambulated with the  help of cardiac rehab and felt stable for discharge.  He had earlier than expected recovery, initially plan was to electively repeat cardiac catheterization and angioplasty the following day after elective diagnostic angiography in view of left main disease however due to instability underwent emergent cardiac catheterization.  Recommendations on discharge: Continue DAPT with aspirin and Plavix for at least 6 months and Plavix indefinitely.  Continue statin therapy, uptitrate beta-blocker therapy as tolerated to keep heart rate around 70 to 80 bpm. Omeprazole has been discontinued and switched over to pantoprazole and can be taken on a as needed basis.  Discharge Exam:    12/23/2023   10:30 AM 12/23/2023   10:24 AM 12/23/2023   10:00 AM  Vitals with BMI  Systolic  132   Diastolic  71   Pulse 104 99 101     Physical Exam Neck:     Vascular: No carotid bruit or JVD.  Cardiovascular:     Rate and Rhythm: Normal rate and regular rhythm.     Pulses: Intact distal pulses.     Heart sounds: Normal heart sounds. No murmur heard.    No gallop.  Pulmonary:     Effort: Pulmonary effort is normal.     Breath sounds: Normal breath sounds.  Abdominal:     General: Bowel sounds are normal.     Palpations: Abdomen is soft.  Musculoskeletal:     Right lower leg: No edema.     Left lower leg: No edema.   Right radial access and femoral access without complications  Labs:   Lab Results  Component Value Date   WBC 7.5 12/23/2023   HGB 11.8 (L) 12/23/2023   HCT 34.7 (  L) 12/23/2023   MCV 84.4 12/23/2023   PLT 221 12/23/2023    Recent Labs  Lab 12/23/23 0341  NA 138  K 3.4*  CL 105  CO2 21*  BUN 13  CREATININE 0.83  CALCIUM 8.6*  GLUCOSE 103*    Lipid Panel     Component Value Date/Time   CHOL 87 12/23/2023 0341   TRIG 93 12/23/2023 0341   HDL 29 (L) 12/23/2023 0341   CHOLHDL 3.0 12/23/2023 0341   VLDL 19 12/23/2023 0341   LDLCALC 39 12/23/2023 0341    Cardiac Panel (last 3  results) Recent Labs    12/22/23 1033 12/22/23 1143  TROPONINIHS 474* 425*     TSH No results for input(s): "TSH" in the last 8760 hours.  FOLLOW UP PLANS AND APPOINTMENTS Discharge Instructions     Amb Referral to Cardiac Rehabilitation   Complete by: As directed    Diagnosis:  Coronary Stents NSTEMI     After initial evaluation and assessments completed: Virtual Based Care may be provided alone or in conjunction with Phase 2 Cardiac Rehab based on patient barriers.: Yes   Intensive Cardiac Rehabilitation (ICR) [facility] location only OR Traditional Cardiac Rehabilitation (TCR) *If criteria for ICR are not met will enroll in TCR [facility] only): Yes   Diet - low sodium heart healthy   Complete by: As directed    Discharge instructions   Complete by: As directed    No driving for 2 days. No lifting over 5 lbs for 1 week. No sexual activity for 1 week. Keep procedure site clean & dry. If you notice increased pain, swelling, bleeding or pus, call/return!  You may shower, but no soaking baths/hot tubs/pools for 1 week.   Increase activity slowly   Complete by: As directed       Allergies as of 12/23/2023   No Known Allergies      Medication List     STOP taking these medications    omeprazole 40 MG capsule Commonly known as: PRILOSEC       TAKE these medications    aspirin EC 81 MG tablet Take 1 tablet (81 mg total) by mouth daily. Swallow whole.   clopidogrel 75 MG tablet Commonly known as: PLAVIX Take 1 tablet (75 mg total) by mouth daily with breakfast.   cyclobenzaprine 10 MG tablet Commonly known as: FLEXERIL Take 10 mg by mouth daily as needed for muscle spasms. evening   FeroSul 325 (65 Fe) MG tablet Generic drug: ferrous sulfate Take 325 mg by mouth daily.   losartan-hydrochlorothiazide 100-25 MG tablet Commonly known as: HYZAAR Take 1 tablet by mouth daily.   nitroGLYCERIN 0.4 MG SL tablet Commonly known as: NITROSTAT Place 1 tablet (0.4 mg total) under  the tongue every 5 (five) minutes x 3 doses as needed for chest pain.   pantoprazole 40 MG tablet Commonly known as: PROTONIX Take 1 tablet (40 mg total) by mouth daily as needed (Heart burn and acid reflux).   rosuvastatin 20 MG tablet Commonly known as: CRESTOR Take 20 mg by mouth at bedtime.        Follow-up Information     Mallipeddi, Vishnu P, MD. Call.   Specialties: Cardiology, Internal Medicine Why: To be seen in 1-2 weeks Contact information: [address] [place] [address] [phone]         Drucie Opitz, MD Follow up.   Specialty: Family Medicine Why: please call to arrange hospital follow up in 1-2 weeks Contact  information: [address] [place], [place] [address] [phone]                Yates Decamp, MD, [facility] 12/23/2023, 4:34 PM [facility] [address] [place], [place] [address] Phone: [phone]. Fax:  [phone]

## 2023-12-24 LAB — LIPOPROTEIN A (LPA): Lipoprotein (a): 26.2 nmol/L (ref <75.0)

## 2023-12-29 ENCOUNTER — Telehealth (HOSPITAL_COMMUNITY): Payer: Self-pay | Admitting: *Deleted

## 2023-12-29 NOTE — Telephone Encounter (Signed)
 Cardiac Rehab Phase II referral faxed to [facility] per pt request. Ethelda Chick BS, ACSM-CEP 12/29/2023 2:02 PM

## 2023-12-31 ENCOUNTER — Ambulatory Visit: Attending: Student | Admitting: Student

## 2023-12-31 ENCOUNTER — Encounter: Payer: Self-pay | Admitting: Student

## 2023-12-31 VITALS — BP 132/68 | HR 80 | Ht 62.0 in | Wt 147.0 lb

## 2023-12-31 DIAGNOSIS — I10 Essential (primary) hypertension: Secondary | ICD-10-CM | POA: Diagnosis present

## 2023-12-31 DIAGNOSIS — E785 Hyperlipidemia, unspecified: Secondary | ICD-10-CM

## 2023-12-31 DIAGNOSIS — I2510 Atherosclerotic heart disease of native coronary artery without angina pectoris: Secondary | ICD-10-CM | POA: Diagnosis not present

## 2023-12-31 DIAGNOSIS — I214 Non-ST elevation (NSTEMI) myocardial infarction: Secondary | ICD-10-CM

## 2023-12-31 DIAGNOSIS — I255 Ischemic cardiomyopathy: Secondary | ICD-10-CM | POA: Diagnosis present

## 2023-12-31 MED ORDER — ROSUVASTATIN CALCIUM 20 MG PO TABS
20.0000 mg | ORAL_TABLET | Freq: Every day | ORAL | 3 refills | Status: DC
Start: 2023-12-31 — End: 2024-01-01

## 2023-12-31 MED ORDER — PANTOPRAZOLE SODIUM 40 MG PO TBEC
40.0000 mg | DELAYED_RELEASE_TABLET | Freq: Every day | ORAL | 3 refills | Status: DC | PRN
Start: 2023-12-31 — End: 2024-01-01

## 2023-12-31 MED ORDER — CLOPIDOGREL BISULFATE 75 MG PO TABS: 75.0000 mg | ORAL_TABLET | Freq: Every day | ORAL | 3 refills | Status: DC

## 2023-12-31 NOTE — Progress Notes (Addendum)
 Cardiology Office Note    Date:  12/31/2023  ID:  Kevin Faulkner, DOB 1933/12/25, MRN [id number] Cardiologist: Marjo Bicker, MD    History of Present Illness:    Kevin Faulkner is a 89 y.o. male with past medical history of hypertension and recently diagnosed CAD who presents to the office today for hospital follow-up.  He most recently presented to [facility] ED on 12/22/2023 for evaluation of intermittent chest tightness which had started several days prior while doing yard work. He was found to have an NSTEMI with Hs Troponin values at 474 and 425. Echocardiogram showed his EF was at 45 to 50% and he did have wall motion abnormalities with the entire septum and apex being hypokinetic. Right ventricular function was normal. He underwent cardiac catheterization which showed critical ostial 95% left main stenosis. He had instability while in the catheterization lab with refractory chest pain and hypotension, therefore emergent PCI was performed with DES placement to the left main. He did have residual 25% stenosis along the RCA. The following morning, he was asymptomatic and denied any recurrent anginal symptoms. It was recommended to continue DAPT with ASA and Plavix for at least 6 months and Plavix indefinitely given LM stent. He had been taking Omeprazole and this was discontinued and switched to Protonix 40 mg daily. Simvastatin was discontinued and he was switched to Crestor 20 mg daily.  In talking with the patient and his daughter today, he reports overall feeling well since his recent hospitalization. He is back to doing yard work routinely and walked over a mile today and denies any recent chest pain or dyspnea on exertion with this. Reports he had actually been experiencing shortness of breath for over a month prior to his stent placement and this has significantly improved. No specific orthopnea, PND or pitting edema. No complications regarding his radial or groin cath sites. Reports  good compliance with his medications, including ASA and Plavix. No reports of melena, hematochezia or hematuria.  Studies Reviewed:   EKG: EKG is ordered today and demonstrates:   EKG Interpretation Date/Time:  Wednesday December 31 2023 13:58:58 EDT Ventricular Rate:  101 PR Interval:  152 QRS Duration:  104 QT Interval:  364 QTC Calculation: 471 R Axis:   -13  Text Interpretation: Sinus tachycardia Minimal voltage criteria for LVH, may be normal variant ( R in aVL ) No acute ST changes. Confirmed by Randall An ([id number]) on 12/31/2023 2:01:58 PM       Cardiac Catheterization: 11/2023   Ost LM lesion is 95% stenosed.   Prox RCA lesion is 25% stenosed.   A drug-eluting stent was successfully placed using a SYNERGY XD 3.50X12.   Post intervention, there is a 0% residual stenosis.   The left ventricular systolic function is normal.   LV end diastolic pressure is normal.   The left ventricular ejection fraction is 55-65% by visual estimate.   Recommend uninterrupted dual antiplatelet therapy with Aspirin 81mg  daily and Clopidogrel 75mg  daily for a minimum of 12 months (ACS-Class I recommendation).   Critical ostial left main stenosis Normal LV function Normal LVEDP Acute instability in the lab with refractory chest pain and hypotension. Successful emergent PCI with stenting of the Left main with DES x 1 via femoral approach.    Plan: DAPT for at least one year. Check Echo.   Echocardiogram: 12/2023 IMPRESSIONS     1. Left ventricular ejection fraction, by estimation, is 45 to 50%. Left  ventricular ejection fraction  by 2D MOD biplane is 45.8 %. The left  ventricle has mildly decreased function. The left ventricle demonstrates  regional wall motion abnormalities (see   scoring diagram/findings for description). There is moderate concentric  left ventricular hypertrophy. Left ventricular diastolic parameters are  consistent with Grade I diastolic dysfunction (impaired  relaxation).   2. Right ventricular systolic function is normal. The right ventricular  size is normal. Tricuspid regurgitation signal is inadequate for assessing  PA pressure.   3. The mitral valve is normal in structure. No evidence of mitral valve  regurgitation.   4. The aortic valve is tricuspid. Aortic valve regurgitation is not  visualized. Aortic valve sclerosis is present, with no evidence of aortic  valve stenosis.   5. The inferior vena cava is normal in size with greater than 50%  respiratory variability, suggesting right atrial pressure of 3 mmHg.     Physical Exam:   VS:  BP 132/68   Pulse 80   Ht 5\' 2"  (1.575 m)   Wt 147 lb (66.7 kg)   SpO2 96%   BMI 26.89 kg/m    Wt Readings from Last 3 Encounters:  12/31/23 147 lb (66.7 kg)  12/22/23 149 lb 14.6 oz (68 kg)  01/06/17 151 lb (68.5 kg)     GEN: Pleasant, elderly male appearing in no acute distress NECK: No JVD; No carotid bruits CARDIAC: RRR, no murmurs, rubs, gallops RESPIRATORY:  Clear to auscultation without rales, wheezing or rhonchi  ABDOMEN: Appears non-distended. No obvious abdominal masses. EXTREMITIES: No clubbing or cyanosis. No pitting edema.  Distal pedal pulses are 2+ bilaterally. Radial cath site with mild ecchymosis (patient declined groin site eval but denied any swelling or significant bruising at this time).    Assessment and Plan:   1. CAD/Subsequent Episode of Care for NSTEMI - He recently underwent DES placement to the left main as outlined above and denies any recent anginal symptoms.  Overall progressing well at home. EKG today is without acute ST changes. - The importance of continued compliance with DAPT was reviewed and will continue ASA 81 mg daily and Plavix 75 mg daily. Was previously recommended to continue Plavix indefinitely given LM stent. Continue Crestor 20 mg daily and Losartan 100 mg daily.  2. Ischemic Cardiomyopathy - His EF was slightly reduced at 45 to 50% by  echocardiogram during recent admission. He appears euvolemic by examination today and denies any recent respiratory issues. Will plan for a follow-up limited echocardiogram in approximately 3 months for reassessment. Continue Losartan-HCTZ 100-25 mg daily for now.  3. HLD - FLP during recent admission showed his LDL was at 39 and LPa was at 26.2. He is on Crestor 20 mg daily and will continue current dosing for now. Would recheck an FLP at his next office visit if not obtained by his PCP in the interim (he reports having labs with his PCP every 3 to 6 months).   4. HTN - His blood pressure was initially recorded at 146/74, rechecked and improved to 132/68. Reports this has been well-controlled when checked in the ambulatory setting. Continue current medical therapy with Losartan-HCTZ 100-25 mg daily. Most recent labs on 12/23/2023 showed that he was hypokalemic with K+ at 3.4. He is scheduled for follow-up labs with his PCP later this week and will order a BMET to make sure this is obtained.   Signed, Ellsworth Lennox, PA-C

## 2023-12-31 NOTE — Patient Instructions (Signed)
 Medication Instructions:  Your physician recommends that you continue on your current medications as directed. Please refer to the Current Medication list given to you today.  *If you need a refill on your cardiac medications before your next appointment, please call your pharmacy*  Lab Work: Your physician recommends that you return for lab work at next lab draw. ( BMET)   If you have labs (blood work) drawn today and your tests are completely normal, you will receive your results only by: MyChart Message (if you have MyChart) OR A paper copy in the mail If you have any lab test that is abnormal or we need to change your treatment, we will call you to review the results.  Testing/Procedures: Your physician has requested that you have an echocardiogram. Echocardiography is a painless test that uses sound waves to create images of your heart. It provides your doctor with information about the size and shape of your heart and how well your heart's chambers and valves are working. This procedure takes approximately one hour. There are no restrictions for this procedure. Please do NOT wear cologne, perfume, aftershave, or lotions (deodorant is allowed). Please arrive 15 minutes prior to your appointment time.  Please note: We ask at that you not bring children with you during ultrasound (echo/ vascular) testing. Due to room size and safety concerns, children are not allowed in the ultrasound rooms during exams. Our front office staff cannot provide observation of children in our lobby area while testing is being conducted. An adult accompanying a patient to their appointment will only be allowed in the ultrasound room at the discretion of the ultrasound technician under special circumstances. We apologize for any inconvenience.   Follow-Up: At [facility], you and your health needs are our priority.  As part of our continuing mission to provide you with exceptional heart care, our  providers are all part of one team.  This team includes your primary Cardiologist (physician) and Advanced Practice Providers or APPs (Physician Assistants and Nurse Practitioners) who all work together to provide you with the care you need, when you need it.  Your next appointment:    August   Provider:   You may see Vishnu P Mallipeddi, MD or one of the following Advanced Practice Providers on your designated Care Team:   [place], PA-C  [place], [place] Jacolyn Reedy, [place]     We recommend signing up for the patient portal called "MyChart".  Sign up information is provided on this After Visit Summary.  MyChart is used to connect with patients for Virtual Visits (Telemedicine).  Patients are able to view lab/test results, encounter notes, upcoming appointments, etc.  Non-urgent messages can be sent to your provider as well.   To learn more about what you can do with MyChart, go to [email or web address].   Other Instructions Thank you for choosing [facility]!

## 2024-01-01 ENCOUNTER — Other Ambulatory Visit: Payer: Self-pay | Admitting: Student

## 2024-01-01 MED ORDER — ROSUVASTATIN CALCIUM 20 MG PO TABS: 20.0000 mg | ORAL_TABLET | Freq: Every day | ORAL | 3 refills | Status: AC

## 2024-01-01 MED ORDER — CLOPIDOGREL BISULFATE 75 MG PO TABS: 75.0000 mg | ORAL_TABLET | Freq: Every day | ORAL | 3 refills | Status: DC

## 2024-01-01 MED ORDER — PANTOPRAZOLE SODIUM 40 MG PO TBEC: 40.0000 mg | DELAYED_RELEASE_TABLET | Freq: Every day | ORAL | 3 refills | Status: AC | PRN

## 2024-01-09 ENCOUNTER — Ambulatory Visit: Admitting: Student

## 2024-01-26 ENCOUNTER — Ambulatory Visit: Admitting: Internal Medicine

## 2024-03-30 ENCOUNTER — Ambulatory Visit: Payer: Self-pay | Admitting: Student

## 2024-03-30 ENCOUNTER — Ambulatory Visit (HOSPITAL_COMMUNITY)
Admission: RE | Admit: 2024-03-30 | Discharge: 2024-03-30 | Disposition: A | Discharge status: Home / Self Care | Source: Ambulatory Visit | Attending: Student | Admitting: Student

## 2024-03-30 DIAGNOSIS — I255 Ischemic cardiomyopathy: Secondary | ICD-10-CM | POA: Diagnosis present

## 2024-03-30 LAB — ECHOCARDIOGRAM LIMITED
Est EF: 55
S' Lateral: 3.2 cm

## 2024-03-30 NOTE — Progress Notes (Signed)
*  PRELIMINARY RESULTS* Echocardiogram Limited 2-D Echocardiogram has been performed.  Kevin Faulkner 03/30/2024, 3:49 PM

## 2024-05-06 ENCOUNTER — Encounter: Payer: Self-pay | Admitting: Internal Medicine

## 2024-05-06 ENCOUNTER — Ambulatory Visit: Attending: Internal Medicine | Admitting: Internal Medicine

## 2024-05-06 VITALS — BP 142/62 | HR 87 | Ht 64.0 in | Wt 143.0 lb

## 2024-05-06 DIAGNOSIS — E785 Hyperlipidemia, unspecified: Secondary | ICD-10-CM | POA: Insufficient documentation

## 2024-05-06 DIAGNOSIS — Z7902 Long term (current) use of antithrombotics/antiplatelets: Secondary | ICD-10-CM | POA: Diagnosis present

## 2024-05-06 DIAGNOSIS — E7849 Other hyperlipidemia: Secondary | ICD-10-CM | POA: Insufficient documentation

## 2024-05-06 DIAGNOSIS — I10 Essential (primary) hypertension: Secondary | ICD-10-CM | POA: Diagnosis present

## 2024-05-06 MED ORDER — CLOPIDOGREL BISULFATE 75 MG PO TABS: 75.0000 mg | ORAL_TABLET | Freq: Every day | ORAL | 3 refills | Status: AC

## 2024-05-06 NOTE — Patient Instructions (Signed)
 Medication Instructions:  Your physician recommends that you continue on your current medications as directed. Please refer to the Current Medication list given to you today.  *If you need a refill on your cardiac medications before your next appointment, please call your pharmacy*  Lab Work: NONE   If you have labs (blood work) drawn today and your tests are completely normal, you will receive your results only by: MyChart Message (if you have MyChart) OR A paper copy in the mail If you have any lab test that is abnormal or we need to change your treatment, we will call you to review the results.  Testing/Procedures: NONE   Follow-Up: At [facility], you and your health needs are our priority.  As part of our continuing mission to provide you with exceptional heart care, our providers are all part of one team.  This team includes your primary Cardiologist (physician) and Advanced Practice Providers or APPs (Physician Assistants and Nurse Practitioners) who all work together to provide you with the care you need, when you need it.  Your next appointment:   6 month(s)  Provider:   You may see Vishnu P Mallipeddi, MD or one of the following Advanced Practice Providers on your designated Care Team:   [place], PA-C  Scotesia Heflin, [place] Theotis Flake, [place]     We recommend signing up for the patient portal called "MyChart".  Sign up information is provided on this After Visit Summary.  MyChart is used to connect with patients for Virtual Visits (Telemedicine).  Patients are able to view lab/test results, encounter notes, upcoming appointments, etc.  Non-urgent messages can be sent to your provider as well.   To learn more about what you can do with MyChart, go to [email or web address].   Other Instructions Thank you for choosing [facility]!

## 2024-05-06 NOTE — Progress Notes (Signed)
 Cardiology Office Note  Date: 05/06/2024   ID: Kevin Faulkner, DOB [birth year], MRN [id number]  PCP:  Inge Ozell SQUIBB, MD  Cardiologist:  Diannah SQUIBB [place], MD Electrophysiologist:  None   History of Present Illness: Kevin Faulkner is a 89 y.o. male  Patient had NSTEMI in March 2025, s/p LM PCI.  No angina since PCI.  No other symptoms of DOE, dizziness, syncope, palpitations, Compliant with medications and has no side effects.  Accompanied by partner, they would be married in a few days.  Past Medical History:  Diagnosis Date   CAD (coronary artery disease)    a. s/p NSTEMI in 11/2023 with DES to LM   Hypertension     Past Surgical History:  Procedure Laterality Date   CORONARY STENT INTERVENTION N/A 12/22/2023   Procedure: CORONARY STENT INTERVENTION;  Surgeon: [place], Peter M, MD;  Location: [facility] INVASIVE CV LAB;  Service: Cardiovascular;  Laterality: N/A;   LEFT HEART CATH AND CORONARY ANGIOGRAPHY N/A 12/22/2023   Procedure: LEFT HEART CATH AND CORONARY ANGIOGRAPHY;  Surgeon: [place], Peter M, MD;  Location: [facility] INVASIVE CV LAB;  Service: Cardiovascular;  Laterality: N/A;    Current Outpatient Medications  Medication Sig Dispense Refill   aspirin EC 81 MG tablet Take 1 tablet (81 mg total) by mouth daily. Swallow whole. 150 tablet 2   cyclobenzaprine (FLEXERIL) 10 MG tablet Take 10 mg by mouth daily as needed for muscle spasms. evening     losartan-hydrochlorothiazide (HYZAAR) 100-25 MG tablet Take 1 tablet by mouth daily.     nitroGLYCERIN (NITROSTAT) 0.4 MG SL tablet Place 1 tablet (0.4 mg total) under the tongue every 5 (five) minutes x 3 doses as needed for chest pain. 25 tablet 2   pantoprazole (PROTONIX) 40 MG tablet Take 1 tablet (40 mg total) by mouth daily as needed (Heart burn and acid reflux). 90 tablet 3   rosuvastatin (CRESTOR) 20 MG tablet Take 1 tablet (20 mg total) by mouth at bedtime. 90 tablet 3   clopidogrel (PLAVIX) 75 MG tablet Take 1 tablet (75 mg  total) by mouth daily with breakfast. 90 tablet 3   FEROSUL 325 (65 Fe) MG tablet Take 325 mg by mouth daily. (Patient not taking: Reported on 05/06/2024)     No current facility-administered medications for this visit.   Allergies:  Patient has no known allergies.   Social History: The patient  reports that he has never smoked. He does not have any smokeless tobacco history on file. He reports that he does not drink alcohol and does not use drugs.   Family History: The patient's family history is not on file.   ROS:  Please see the history of present illness. Otherwise, complete review of systems is positive for none.  All other systems are reviewed and negative.   Physical Exam: VS:  BP (!) 142/62 (BP Location: Right Arm, Patient Position: Sitting, Cuff Size: Normal)   Pulse 87   Ht 5' 4 (1.626 m)   Wt 143 lb (64.9 kg)   SpO2 94%   BMI 24.55 kg/m , BMI Body mass index is 24.55 kg/m.  Wt Readings from Last 3 Encounters:  05/06/24 143 lb (64.9 kg)  12/31/23 147 lb (66.7 kg)  12/22/23 149 lb 14.6 oz (68 kg)    General: Patient appears comfortable at rest. HEENT: Conjunctiva and lids normal, oropharynx clear with moist mucosa. Neck: Supple, no elevated JVP or carotid bruits, no thyromegaly. Lungs: Clear to auscultation, nonlabored breathing at  rest. Cardiac: Regular rate and rhythm, no S3 or significant systolic murmur, no pericardial rub. Abdomen: Soft, nontender, no hepatomegaly, bowel sounds present, no guarding or rebound. Extremities: No pitting edema, distal pulses 2+. Skin: Warm and dry. Musculoskeletal: No kyphosis. Neuropsychiatric: Alert and oriented x3, affect grossly appropriate.  Recent Labwork: 12/23/2023: BUN 13; Creatinine, Ser 0.83; Hemoglobin 11.8; Platelets 221; Potassium 3.4; Sodium 138     Component Value Date/Time   CHOL 87 12/23/2023 0341   TRIG 93 12/23/2023 0341   HDL 29 (L) 12/23/2023 0341   CHOLHDL 3.0 12/23/2023 0341   VLDL 19 12/23/2023 0341    LDLCALC 39 12/23/2023 0341     Assessment and Plan:  CAD manifested by NSTEMI s/p LM PCI in March 2025 - No angina after PCI. - Continue DAPT for total duration of 1 year.  Did not miss any doses. - Continue aspirin 81 mg once daily, Plavix 75 mg once daily.  Provided refills for Plavix today. - Continue rosuvastatin 20 mg nightly. - SL NTG 0.4 mg as needed for chest pain. - ER precautions for chest pain. - Echocardiogram showed normal LVEF and no valvular heart disease.  HLD, at goal - Continue rosuvastatin 20 mg nightly.  LDL 39, normal.  HTN, controlled - Continue losartan-HCTZ 100-25 mg once daily.   I spent 30 minutes in reviewing prior records, tests, imaging, reports, more than 3 labs, discussion of CAD, HLD, HTN with the patient and documentation.   Medication Adjustments/Labs and Tests Ordered: Current medicines are reviewed at length with the patient today.  Concerns regarding medicines are outlined above.    Disposition:  Follow up   Signed, Meiling Hendriks Arleta Maywood, MD, 05/06/2024 10:57 AM    [facility] at [facility] [address] [place], [place] [address]

## 2024-07-25 ENCOUNTER — Emergency Department (HOSPITAL_COMMUNITY)
Admission: EM | Admit: 2024-07-25 | Discharge: 2024-07-25 | Disposition: A | Discharge status: Home / Self Care | Attending: Emergency Medicine | Admitting: Emergency Medicine

## 2024-07-25 ENCOUNTER — Other Ambulatory Visit: Payer: Self-pay

## 2024-07-25 ENCOUNTER — Emergency Department (HOSPITAL_COMMUNITY)

## 2024-07-25 DIAGNOSIS — Z7902 Long term (current) use of antithrombotics/antiplatelets: Secondary | ICD-10-CM | POA: Diagnosis not present

## 2024-07-25 DIAGNOSIS — J069 Acute upper respiratory infection, unspecified: Secondary | ICD-10-CM | POA: Diagnosis not present

## 2024-07-25 DIAGNOSIS — I10 Essential (primary) hypertension: Secondary | ICD-10-CM | POA: Insufficient documentation

## 2024-07-25 DIAGNOSIS — I2510 Atherosclerotic heart disease of native coronary artery without angina pectoris: Secondary | ICD-10-CM | POA: Diagnosis not present

## 2024-07-25 DIAGNOSIS — R059 Cough, unspecified: Secondary | ICD-10-CM | POA: Diagnosis present

## 2024-07-25 DIAGNOSIS — Z7982 Long term (current) use of aspirin: Secondary | ICD-10-CM | POA: Diagnosis not present

## 2024-07-25 DIAGNOSIS — J3489 Other specified disorders of nose and nasal sinuses: Secondary | ICD-10-CM | POA: Diagnosis not present

## 2024-07-25 DIAGNOSIS — Z79899 Other long term (current) drug therapy: Secondary | ICD-10-CM | POA: Insufficient documentation

## 2024-07-25 LAB — RESP PANEL BY RT-PCR (RSV, FLU A&B, COVID)  RVPGX2
Influenza A by PCR: NEGATIVE
Influenza B by PCR: NEGATIVE
Resp Syncytial Virus by PCR: NEGATIVE
SARS Coronavirus 2 by RT PCR: NEGATIVE

## 2024-07-25 LAB — GROUP A STREP BY PCR: Group A Strep by PCR: NOT DETECTED

## 2024-07-25 MED ORDER — LIDOCAINE VISCOUS HCL 2 % MT SOLN
15.0000 mL | OROMUCOSAL | 0 refills | Status: AC | PRN
Start: 2024-07-25 — End: ?

## 2024-07-25 MED ORDER — LIDOCAINE VISCOUS HCL 2 % MT SOLN: 15.0000 mL | OROMUCOSAL | 0 refills | Status: DC | PRN

## 2024-07-25 MED ORDER — BENZONATATE 100 MG PO CAPS: 100.0000 mg | ORAL_CAPSULE | Freq: Three times a day (TID) | ORAL | 0 refills | Status: DC | PRN

## 2024-07-25 MED ORDER — BENZONATATE 100 MG PO CAPS: 100.0000 mg | ORAL_CAPSULE | Freq: Three times a day (TID) | ORAL | 0 refills | Status: AC | PRN

## 2024-07-25 NOTE — Discharge Instructions (Addendum)
 It was a pleasure taking care of you today.  Based on your history and physical exam I feel you are safe for discharge.  Unfortunately you decided to leave prior to your COVID, flu, RSV testing returning.  Please monitor your MyChart for these results.  Your strep test was negative and your chest x-ray was reassuring.  I have sent in 2 medications to the pharmacy 1 is Tessalon Perles to help with cough and the other is viscous lidocaine  which will help with sore throat.  Please continue to take over-the-counter cough and cold medications to help with your symptoms.  I recommend Zyrtec or Claritin to help with congestion.  You may also use Flonase which is a nasal spray to help with nasal congestion as well.  If you develop any the following symptoms including but not limited to fever, chills, chest pain, shortness of breath, unexplained weakness, or other concerning symptom please return to the emergency department or seek further medical care.  If symptoms persist or worsen recommend follow-up within 48 hours with your primary care provider or back in the emergency department.

## 2024-07-25 NOTE — ED Provider Notes (Cosign Needed Addendum)
 [name] EMERGENCY DEPARTMENT AT [facility] Provider Note   CSN: [id number] Arrival date & time: 07/25/24  9147     Patient presents with: Cough and Sore Throat   Kevin Faulkner is a 89 y.o. male who presents to the emergency department with a chief complaint of sore throat, nonproductive cough as well as congestion since Wednesday night into Thursday morning.  Patient states that he has attempted over-the-counter cold and flu medicines without much relief.  Patient denies known sick contacts but does go to many activities at church.  Wife states that patient has been experiencing some yellow drainage out of his nose.  Denies fever, chills, chest pain, shortness of breath, abdominal pain, nausea, or vomiting.  Past medical history significant for coronary artery disease, hypertension, hyperlipidemia, etc.  Patient denies known lung disease.    Cough Sore Throat       Prior to Admission medications   Medication Sig Start Date End Date Taking? Authorizing Provider  aspirin  EC 81 MG tablet Take 1 tablet (81 mg total) by mouth daily. Swallow whole. 12/23/23 12/22/24  [facility], Jon Garre, PA  benzonatate (TESSALON) 100 MG capsule Take 1 capsule (100 mg total) by mouth 3 (three) times daily as needed for cough. 07/25/24   Briyonna Omara F, PA-C  clopidogrel  (PLAVIX ) 75 MG tablet Take 1 tablet (75 mg total) by mouth daily with breakfast. 05/06/24   Mallipeddi, Vishnu P, MD  cyclobenzaprine (FLEXERIL) 10 MG tablet Take 10 mg by mouth daily as needed for muscle spasms. evening 11/15/23   [provider]  FEROSUL 325 (65 Fe) MG tablet Take 325 mg by mouth daily. Patient not taking: Reported on 05/06/2024 12/02/23   [provider]  lidocaine  (XYLOCAINE ) 2 % solution Use as directed 15 mLs in the mouth or throat as needed for mouth pain. 07/25/24   Anette Barra F, PA-C  losartan-hydrochlorothiazide (HYZAAR) 100-25 MG tablet Take 1 tablet by mouth daily.    [provider]  nitroGLYCERIN  (NITROSTAT ) 0.4 MG SL tablet Place 1 tablet (0.4 mg total) under the tongue every 5 (five) minutes x 3 doses as needed for chest pain. 12/23/23   Ladona Heinz, MD  pantoprazole  (PROTONIX ) 40 MG tablet Take 1 tablet (40 mg total) by mouth daily as needed (Heart burn and acid reflux). 01/01/24   Strader, Laymon HERO, PA-C  rosuvastatin  (CRESTOR ) 20 MG tablet Take 1 tablet (20 mg total) by mouth at bedtime. 01/01/24   Strader, Laymon HERO, PA-C    Allergies: Patient has no known allergies.    Review of Systems  Respiratory:  Positive for cough.     Updated Vital Signs BP (!) 161/69 (BP Location: Right Arm)   Pulse 74   Temp 97.9 F (36.6 C) (Oral)   Resp 19   Ht 5' 4 (1.626 m)   Wt 63.5 kg   SpO2 96%   BMI 24.03 kg/m   Physical Exam Vitals and nursing note reviewed.  Constitutional:      General: He is awake. He is not in acute distress.    Appearance: He is well-developed. He is not ill-appearing, toxic-appearing or diaphoretic.  HENT:     Head: Normocephalic and atraumatic.     Right Ear: Ear canal normal. No tenderness. Tympanic membrane is not erythematous.     Left Ear: Ear canal normal. No tenderness. Tympanic membrane is not erythematous.     Nose: Rhinorrhea present.     Mouth/Throat:     Mouth:  Mucous membranes are moist. No oral lesions.     Pharynx: Posterior oropharyngeal erythema present. No pharyngeal swelling, oropharyngeal exudate or uvula swelling.     Tonsils: No tonsillar exudate or tonsillar abscesses.  Eyes:     Conjunctiva/sclera: Conjunctivae normal.  Cardiovascular:     Rate and Rhythm: Normal rate and regular rhythm.  Pulmonary:     Effort: Pulmonary effort is normal. No respiratory distress.     Breath sounds: No wheezing, rhonchi or rales.  Musculoskeletal:     Cervical back: Normal range of motion.  Skin:    General: Skin is warm.     Capillary Refill: Capillary refill takes less than 2 seconds.  Neurological:      General: No focal deficit present.     Mental Status: He is alert and oriented to person, place, and time.  Psychiatric:        Mood and Affect: Mood normal.        Behavior: Behavior normal. Behavior is cooperative.     (all labs ordered are listed, but only abnormal results are displayed) Labs Reviewed  GROUP A STREP BY PCR  RESP PANEL BY RT-PCR (RSV, FLU A&B, COVID)  [id number]    EKG: None  Radiology: DG Chest 2 View Result Date: 07/25/2024 EXAM: 2 VIEW(S) XRAY OF THE CHEST 07/25/2024 09:54:10 AM COMPARISON: 12/22/2023 CLINICAL HISTORY: Cough FINDINGS: LUNGS AND PLEURA: No focal pulmonary opacity. No pulmonary edema. No pleural effusion. No pneumothorax. HEART AND MEDIASTINUM: Calcified aorta. No acute abnormality of the cardiac and mediastinal silhouettes. BONES AND SOFT TISSUES: No acute osseous abnormality. IMPRESSION: 1. No acute cardiopulmonary process. Electronically signed by: Norleen Kil MD 07/25/2024 10:04 AM EST RP Workstation: [id number]     Procedures   Medications Ordered in the ED - No data to display                                  Medical Decision Making Amount and/or Complexity of Data Reviewed Radiology: ordered.  Risk Prescription drug management.   Patient presents to the ED for concern of cough, congestion, this involves an extensive number of treatment options, and is a complaint that carries with it a high risk of complications and morbidity.  The differential diagnosis includes viral syndrome, pneumonia, sinusitis, strep throat, COVID, flu, RSV, etc.   Co morbidities that complicate the patient evaluation  Coronary artery disease, hypertension, hyperlipidemia   Additional history obtained:  Wife who is at bedside   Lab Tests:  I Ordered, and personally interpreted labs.  The pertinent results include: Strep negative, COVID, flu, RSV processing at time of patient leaving ER   Imaging Studies ordered:  I ordered imaging studies including  CXR I independently visualized and interpreted imaging which showed no acute disease I agree with the radiologist interpretation   Medicines ordered and prescription drug management: I have reviewed the patients home medicines and have made adjustments as needed   Test Considered:  None as physical exam is reassuring and patient walked out of ER after physical exam   Critical Interventions:  none  Problem List / ED Course:  89 year old male, vital signs stable, presents to the emergency department for a shot for his cold and congestion.  Repeatedly throughout encounter patient was stating that he wanted to leave and that this encounter had taken too long.  At this time testing for strep, COVID, flu, and RSV are currently in process. Physical  exam reassuring, no obvious abnormality of auscultation of heart or lungs, patient does have a dry nonproductive cough but again is repeatedly asking to leave the ER After physical exam educated patient that antibiotics are not normally indicated for what is presumed to be a viral infection as he is currently 3 to 4 days into symptoms, at this time patient began taking a blood pressure cuff and pulse ox stating that he would like to leave, I repeatedly offered the patient cough medication as well as viscous lidocaine  to help with throat pain, patient ultimately agreed to have these medications sent into the pharmacy but walked out of the emergency department Patient did not stay for COVID, flu, RSV results, instructed to look on MyChart and follow-up with PCP Discharge papers given, patient left ER Suspect viral infection at this time as patient denies fever or chills, vitals are stable, patient is on day 3-4 or symptoms which appear to be mild as they are described as rhinorrhea, mild throat pain, and cough, reassuring physical exam   Reevaluation:  After the interventions noted above, I reevaluated the patient and found that they have :stayed  the same   Social Determinants of Health:  none   Dispostion:  After consideration of the diagnostic results and the patients response to treatment, I feel that the patient would benefit from discharge and outpatient therapy as described, follow-up with PCP, recommend follow-up within 48 hours of symptoms worsen.       Final diagnoses:  Upper respiratory tract infection, unspecified type    ED Discharge Orders          Ordered    benzonatate (TESSALON) 100 MG capsule  3 times daily PRN,   Status:  Discontinued        07/25/24 1101    lidocaine  (XYLOCAINE ) 2 % solution  As needed,   Status:  Discontinued        07/25/24 1101    benzonatate (TESSALON) 100 MG capsule  3 times daily PRN        07/25/24 1104    lidocaine  (XYLOCAINE ) 2 % solution  As needed        07/25/24 1104               Ryley Teater F, PA-C 07/25/24 2025    Asiah Browder F, PA-C 07/25/24 2026    Charlyn Sora, MD 07/26/24 [phone]

## 2024-07-25 NOTE — ED Notes (Signed)
 ED Provider at bedside.

## 2024-07-25 NOTE — ED Triage Notes (Signed)
 Pt arrived POV with c/o sore throat and cough since Weds night.

## 2024-11-10 ENCOUNTER — Ambulatory Visit: Admitting: Student
